# Patient Record
Sex: Female | Born: 1980 | Race: White | Hispanic: No | Marital: Married | Smoking: Never smoker
Health system: Southern US, Community
[De-identification: ages and names within clinical notes are randomized; demographics above are authoritative.]

## PROBLEM LIST (undated history)

## (undated) ENCOUNTER — Inpatient Hospital Stay (HOSPITAL_COMMUNITY): Payer: BC Managed Care – PPO

## (undated) DIAGNOSIS — D6851 Activated protein C resistance: Secondary | ICD-10-CM

## (undated) DIAGNOSIS — Z5189 Encounter for other specified aftercare: Secondary | ICD-10-CM

## (undated) DIAGNOSIS — D689 Coagulation defect, unspecified: Secondary | ICD-10-CM

## (undated) DIAGNOSIS — E010 Iodine-deficiency related diffuse (endemic) goiter: Secondary | ICD-10-CM

## (undated) DIAGNOSIS — IMO0001 Reserved for inherently not codable concepts without codable children: Secondary | ICD-10-CM

## (undated) DIAGNOSIS — O99119 Other diseases of the blood and blood-forming organs and certain disorders involving the immune mechanism complicating pregnancy, unspecified trimester: Secondary | ICD-10-CM

## (undated) HISTORY — DX: Iodine-deficiency related diffuse (endemic) goiter: E01.0

## (undated) HISTORY — DX: Coagulation defect, unspecified: D68.9

## (undated) HISTORY — DX: Other diseases of the blood and blood-forming organs and certain disorders involving the immune mechanism complicating pregnancy, unspecified trimester: O99.119

## (undated) HISTORY — DX: Other diseases of the blood and blood-forming organs and certain disorders involving the immune mechanism complicating pregnancy, unspecified trimester: D68.51

## (undated) HISTORY — DX: Reserved for inherently not codable concepts without codable children: IMO0001

## (undated) HISTORY — DX: Encounter for other specified aftercare: Z51.89

---

## 2005-10-05 ENCOUNTER — Other Ambulatory Visit: Admission: RE | Admit: 2005-10-05 | Discharge: 2005-10-05 | Payer: Self-pay | Admitting: Obstetrics and Gynecology

## 2005-10-13 ENCOUNTER — Ambulatory Visit: Payer: Self-pay | Admitting: Oncology

## 2005-12-29 ENCOUNTER — Ambulatory Visit: Payer: Self-pay | Admitting: Family Medicine

## 2006-12-13 HISTORY — PX: DILATION AND CURETTAGE OF UTERUS: SHX78

## 2007-01-11 ENCOUNTER — Ambulatory Visit: Payer: Self-pay | Admitting: Family Medicine

## 2007-01-11 ENCOUNTER — Other Ambulatory Visit: Admission: RE | Admit: 2007-01-11 | Discharge: 2007-01-11 | Payer: Self-pay | Admitting: Family Medicine

## 2007-01-11 ENCOUNTER — Encounter: Payer: Self-pay | Admitting: Family Medicine

## 2007-04-03 ENCOUNTER — Ambulatory Visit: Payer: Self-pay | Admitting: Family Medicine

## 2007-05-17 ENCOUNTER — Ambulatory Visit: Payer: Self-pay | Admitting: Oncology

## 2007-05-23 ENCOUNTER — Ambulatory Visit: Payer: Self-pay | Admitting: Cardiology

## 2007-07-06 ENCOUNTER — Other Ambulatory Visit: Payer: Self-pay | Admitting: Obstetrics & Gynecology

## 2007-07-06 ENCOUNTER — Ambulatory Visit (HOSPITAL_COMMUNITY): Admission: RE | Admit: 2007-07-06 | Discharge: 2007-07-06 | Payer: Self-pay | Admitting: Obstetrics & Gynecology

## 2007-07-06 ENCOUNTER — Encounter (INDEPENDENT_AMBULATORY_CARE_PROVIDER_SITE_OTHER): Payer: Self-pay | Admitting: Obstetrics & Gynecology

## 2008-09-28 ENCOUNTER — Inpatient Hospital Stay (HOSPITAL_COMMUNITY): Admission: AD | Admit: 2008-09-28 | Discharge: 2008-09-30 | Payer: Self-pay | Admitting: Obstetrics and Gynecology

## 2010-01-02 ENCOUNTER — Ambulatory Visit: Payer: Self-pay | Admitting: Family Medicine

## 2010-01-03 LAB — CONVERTED CEMR LAB
ALT: 21 units/L (ref 0–35)
Alkaline Phosphatase: 73 units/L (ref 39–117)
Basophils Absolute: 0 10*3/uL (ref 0.0–0.1)
Basophils Relative: 0.3 % (ref 0.0–3.0)
Bilirubin, Direct: 0 mg/dL (ref 0.0–0.3)
CO2: 25 meq/L (ref 19–32)
Chloride: 108 meq/L (ref 96–112)
Cholesterol: 215 mg/dL — ABNORMAL HIGH (ref 0–200)
Eosinophils Relative: 1.5 % (ref 0.0–5.0)
GFR calc non Af Amer: 124.73 mL/min (ref >60)
HDL: 116.4 mg/dL (ref >39.00)
Lymphocytes Relative: 41.7 % (ref 12.0–46.0)
MCHC: 32.8 g/dL (ref 30.0–36.0)
MCV: 99.8 fL (ref 78.0–100.0)
Monocytes Absolute: 0.4 10*3/uL (ref 0.1–1.0)
Monocytes Relative: 8.7 % (ref 3.0–12.0)
Neutro Abs: 2.3 10*3/uL (ref 1.4–7.7)
Neutrophils Relative %: 47.8 % (ref 43.0–77.0)
Platelets: 255 10*3/uL (ref 150.0–400.0)
Potassium: 4.1 meq/L (ref 3.5–5.1)
Sodium: 141 meq/L (ref 135–145)
TSH: 1.51 microintl units/mL (ref 0.35–5.50)
Total CHOL/HDL Ratio: 2
Total Protein: 8.4 g/dL — ABNORMAL HIGH (ref 6.0–8.3)
Triglycerides: 126 mg/dL (ref 0.0–149.0)
VLDL: 25.2 mg/dL (ref 0.0–40.0)

## 2010-01-07 ENCOUNTER — Ambulatory Visit: Payer: Self-pay | Admitting: Family Medicine

## 2010-01-07 DIAGNOSIS — G4700 Insomnia, unspecified: Secondary | ICD-10-CM

## 2010-01-07 DIAGNOSIS — D689 Coagulation defect, unspecified: Secondary | ICD-10-CM

## 2010-01-30 ENCOUNTER — Encounter: Payer: Self-pay | Admitting: Family Medicine

## 2010-02-02 ENCOUNTER — Telehealth: Payer: Self-pay | Admitting: Family Medicine

## 2010-03-30 ENCOUNTER — Ambulatory Visit: Payer: Self-pay | Admitting: Family Medicine

## 2010-03-30 DIAGNOSIS — J209 Acute bronchitis, unspecified: Secondary | ICD-10-CM

## 2010-12-13 NOTE — L&D Delivery Note (Signed)
Delivery note Pt began to feel pressure and on going to the restroom delivered a macerated fetus.  Cord clamped by RN and I arrived and examined pt. Placenta felt at os and pt attempted to push out remainder, but cord avulsed with placenta still in cervix.  Bleeding moderate with several clots passed.   D/w anesthesia and they felt epidural in room safest way to proceed with placenta delivery as pt has had clear liquids. Pt vital signs stable.  Will get epidural  and proceed with delivery of placenta as soon as pt comfortable.  Cursory examination of baby shows large abdominal wall defect with intestines and liver protruding, but fetus very macerated. At 11:20 PM a non-viable probable female was delivered via Vaginal, Spontaneous Delivery.  APGAR: 0, 0; weight 5 oz (142 g).      Anesthesia: None  Episiotomy: None Lacerations: None Est. Blood Loss (mL): 1000 cc  After epidural was found to be adequate, the patient was placed in stirrups and speculum placed in vagina.  Large amount of clot removed from vagina. Placenta seen  At os and several pieces teased out with ring forcep.  Remainder difficult to reach so additional cytotec placed while I attended another delivery.  Pt's bleeding minimal at this point.   After about 15  Minutes with the cytotec in place, pt reexamined and large piece of placenta removed intact.  The pt did have some brisker bleeding until this fragment was completely removed.   At this point US guidance was called and a gentle curettage with a Banjo curette done under direct visualization.  No other large fragments noted and stripe 1.2 cm at conclusion.  Uterus contracted down and pitocin and methergine given.  Pt had minimal bleeding at this point. Unasyn IV given for 2 doses given fundal exploration.  Will observe bleeding closely.  Oliver Pila 07/23/2011, 1:44 AM

## 2011-01-03 ENCOUNTER — Encounter: Payer: Self-pay | Admitting: Obstetrics and Gynecology

## 2011-01-13 NOTE — Progress Notes (Signed)
Summary: ? drug interaction  Phone Note From Pharmacy   Caller: [facility] Summary of Call: Form regarding possible drug interaction is on your shelf. Initial call taken by: Lowella Petties CMA,  February 02, 2010 12:42 PM  Follow-up for Phone Call        this is more or less a warning that silenor is not safe in pregnancy- and at any point if she could be pregnant - she stop it -- please make sure she is aware  I will sign the letter to scan also  Follow-up by: Judith Part MD,  February 02, 2010 1:33 PM  Additional Follow-up for Phone Call Additional follow up Details #1::        Left message for patient to call back. Lewanda Rife LPN  February 02, 2010 5:04 PM   Advised pt. Additional Follow-up by: Lowella Petties CMA,  February 03, 2010 9:38 AM

## 2011-01-13 NOTE — Assessment & Plan Note (Signed)
Summary: Complete Physical   Vital Signs:  Patient profile:   30 year old female Height:      68.5 inches Weight:      129 pounds BMI:     19.40 Temp:     97.4 degrees F oral Pulse rate:   72 / minute Pulse rhythm:   regular BP sitting:   116 / 84  (left arm) Cuff size:   regular  Vitals Entered By: Lowella Petties CMA (January 07, 2010 2:23 PM) CC: 30 minute check up   History of Present Illness: here for a health mt exam   is generally healthy and no new concerns  sees gyn for regular care -- Dr Senaida Ores / [place]  is thinking about getting pregnant again  is still on prenatal vitamins   wt is down about 5 lb -- from being busy -- and is a good eater   labs chol - trig 126, and HDL 116.4 , and LDL 88  excellent overall  does eat well - good cholesterol   tsh nl  sugar 107 --  other labs ok   is having trouble staying asleep -- and that is catching up with her  wakes up at 2-4 am  is not breastfeeding  pm products - make her groggy  no caffiene  has never tried volarian or melatonin   not feeling depressed / but does feel a little cooked up this time of year  Dr Lloyd Huger px that for her -- but she did not try it    Allergies: 1)  ! Sulfa 2)  ! * Some Vitamins  Past History:  Family History: Last updated: 01/07/2010 sister with breast ca sister with lung ca  Past Medical History: clotting disorder   Family History: sister with breast ca sister with lung ca  Social History: non smoker  no alcohol married 1 child  Review of Systems General:  Complains of fatigue; denies fever, loss of appetite, and malaise. Eyes:  Denies blurring and eye pain. CV:  Denies chest pain or discomfort, lightheadness, palpitations, and shortness of breath with exertion. Resp:  Denies cough and wheezing. GI:  Denies abdominal pain, bloody stools, change in bowel habits, and indigestion. GU:  Denies discharge, dysuria, and urinary frequency. MS:  Denies joint  pain, cramps, and muscle weakness. Derm:  Denies itching, lesion(s), poor wound healing, and rash. Neuro:  Denies numbness and tingling. Psych:  Denies anxiety, depression, panic attacks, and sense of great danger. Endo:  Denies cold intolerance, excessive thirst, excessive urination, and heat intolerance. Heme:  Denies abnormal bruising and bleeding.  Physical Exam  General:  slim and well appearing  Head:  normocephalic, atraumatic, and no abnormalities observed.   Eyes:  vision grossly intact, pupils equal, pupils round, pupils reactive to light, and no injection.   Ears:  R ear normal and L ear normal.   Nose:  no nasal discharge.   Mouth:  pharynx pink and moist.   Neck:  supple with full rom and no masses or thyromegally, no JVD or carotid bruit  Chest Wall:  No deformities, masses, or tenderness noted. Lungs:  Normal respiratory effort, chest expands symmetrically. Lungs are clear to auscultation, no crackles or wheezes. Heart:  Normal rate and regular rhythm. S1 and S2 normal without gallop, murmur, click, rub or other extra sounds. Abdomen:  Bowel sounds positive,abdomen soft and non-tender without masses, organomegaly or hernias noted. Msk:  No deformity or scoliosis noted of thoracic or lumbar spine.  no acute joint changes  Pulses:  R and L carotid,radial,femoral,dorsalis pedis and posterior tibial pulses are full and equal bilaterally Extremities:  No clubbing, cyanosis, edema, or deformity noted with normal full range of motion of all joints.   Neurologic:  sensation intact to light touch, gait normal, and DTRs symmetrical and normal.   Skin:  Intact without suspicious lesions or rashes some lentigos on face and back  1 mm lesion on r inner eyelid- consistent with milia/ comedone Cervical Nodes:  No lymphadenopathy noted Inguinal Nodes:  No significant adenopathy Psych:  normal affect, talkative and pleasant    Impression & Recommendations:  Problem # 1:  HEALTH  MAINTENANCE EXAM (ICD-V70.0) Assessment Comment Only reviewed health habits including diet, exercise and skin cancer prevention reviewed health maintenance list and family history overall healthy with good habits  plan on getting back to exercise when able and continue pnvs  labs reviewed today including good cholesterol profile   Problem # 2:  INSOMNIA (ICD-780.52) Assessment: New with problems staying asleep-multifactorial  failed antihistamines and otc meds will try silenor 6 mg and update  if not imp- may consider trial of ambien Her updated medication list for this problem includes:    Silenor 6 Mg Tabs (Doxepin hcl) .Marland Kitchen... 1/2 to 1 tab by mouth at bedtime as needed insomnia  Complete Medication List: 1)  Prenatal Vitamins  2)  Silenor 6 Mg Tabs (Doxepin hcl) .... 1/2 to 1 tab by mouth at bedtime as needed insomnia  Other Orders: Tdap => 21yrs IM ([address]) Admin 1st Vaccine ([address]) Admin 1st Vaccine [facility]) [phone])  Patient Instructions: 1)  avoid caffiene as much as you can  2)  when able -- get back to exercise  3)  continue prenatal vitamins  4)  try the silanor for sleep  5)  if you want dermatology referral in future- call and let me know  Prescriptions: SILENOR 6 MG TABS (DOXEPIN HCL) 1/2 to 1 tab by mouth at bedtime as needed insomnia  #30 x 3   Entered and Authorized by:   Judith Part MD   Signed by:   Judith Part MD on 01/07/2010   Method used:   [facility]        [facility] [facility]retail)       [address]       [place], [place]  [address]       Ph: [phone] or [phone]       Fax: [phone]   RxID:   [id number]   Prior Medications: Current Allergies: ! SULFA ! * SOME VITAMINS   Preventive Care Screening  Pap Smear:    Date:  11/06/2008    Results:  normal     Tetanus/Td Vaccine    Vaccine Type: Tdap    Site: right deltoid    Mfr: GlaxoSmithKline    Dose: 0.5 ml    Route: IM    Given by: Lowella Petties CMA    Exp. Date: 02/07/2012    Lot #: [id number]    VIS given: 10/31/07 version given January 07, 2010.

## 2011-01-13 NOTE — Assessment & Plan Note (Signed)
Summary: sinus infection/alc 2:15   Vital Signs:  Patient profile:   30 year old female Height:      68.5 inches Weight:      126.75 pounds BMI:     19.06 O2 Sat:      98 % on Room air Temp:     98.4 degrees F oral Pulse rate:   76 / minute Pulse rhythm:   regular Resp:     16 per minute BP sitting:   116 / 80  (left arm) Cuff size:   regular  Vitals Entered By: Lewanda Rife LPN (March 30, 2010 2:11 PM)  O2 Flow:  Room air CC: sinus infections and chest congestion and dry cough   History of Present Illness: 30 yo female here for URI symptoms.  Started with runny nose, dry cough 1 month ago.  At that time had fevers and chills, that has resolved. Still has some sinus congestion and frontal head pressure. Also feels a little winded at time over the past month, not getting acutely worse.  No wheezing. No chest pain.  Does have seasonal allergies, not really taking anything for it. No h/o asthma.  Current Medications (verified): 1)  Silenor 6 Mg Tabs (Doxepin Hcl) .... 1/2 To 1 Tab By Mouth At Bedtime As Needed Insomnia 2)  Camila 0.35 Mg Tabs (Norethindrone) .... Take 1 Tablet By Mouth Once A Day 3)  Multivitamins   Tabs (Multiple Vitamin) .... Take 1 Tablet By Mouth Once A Day 4)  Azithromycin 250 Mg  Tabs (Azithromycin) .... 2 By  Mouth Today and Then 1 Daily For 4 Days 5)  Proair Hfa 108 (90 Base) Mcg/act  Aers (Albuterol Sulfate) .... 2 Inh Q4h As Needed Shortness of Breath  Allergies: 1)  ! Sulfa 2)  ! * Some Vitamins  Review of Systems      See HPI General:  Complains of chills and fever. ENT:  Complains of nasal congestion, sinus pressure, and sore throat; denies difficulty swallowing. CV:  Denies chest pain or discomfort. Resp:  Complains of cough and shortness of breath; denies chest pain with inspiration, sputum productive, and wheezing.  Physical Exam  General:  slim and well appearing no increased WOB VSS- 02 sat 98% RA Ears:  R ear normal and L ear  normal.   Nose:  boggy turbinates, sinuses neg Mouth:  pharynx pink and moist.   Lungs:  Normal respiratory effort, chest expands symmetrically.  Scattered exp wheezes, no crackles Heart:  Normal rate and regular rhythm. S1 and S2 normal without gallop, murmur, click, rub or other extra sounds. Abdomen:  Bowel sounds positive,abdomen soft and non-tender without masses, organomegaly or hernias noted. Extremities:  No clubbing, cyanosis, edema, or deformity noted with normal full range of motion of all joints.   Psych:  normal affect, talkative and pleasant    Impression & Recommendations:  Problem # 1:  ACUTE BRONCHITIS (ICD-466.0) Assessment New Given duration of symptoms, will treat with Zpack. Proair as needed wheezing. Follow up if no improvement in 5 days. Her updated medication list for this problem includes:    Azithromycin 250 Mg Tabs (Azithromycin) .Marland Kitchen... 2 by  mouth today and then 1 daily for 4 days    Proair Hfa 108 (90 Base) Mcg/act Aers (Albuterol sulfate) .Marland Kitchen... 2 inh q4h as needed shortness of breath  Complete Medication List: 1)  Silenor 6 Mg Tabs (Doxepin hcl) .... 1/2 to 1 tab by mouth at bedtime as needed insomnia 2)  Camila  0.35 Mg Tabs (Norethindrone) .... Take 1 tablet by mouth once a day 3)  Multivitamins Tabs (Multiple vitamin) .... Take 1 tablet by mouth once a day 4)  Azithromycin 250 Mg Tabs (Azithromycin) .... 2 by  mouth today and then 1 daily for 4 days 5)  Proair Hfa 108 (90 Base) Mcg/act Aers (Albuterol sulfate) .... 2 inh q4h as needed shortness of breath Prescriptions: PROAIR HFA 108 (90 BASE) MCG/ACT  AERS (ALBUTEROL SULFATE) 2 inh q4h as needed shortness of breath  #1 x 0   Entered and Authorized by:   Ruthe Mannan MD   Signed by:   Ruthe Mannan MD on 03/30/2010   Method used:   [facility]        [facility] [facility]       [address]       [place], [place]  [address]       Ph: [phone] or [phone]       Fax:  [phone]   RxID:   [id number] AZITHROMYCIN 250 MG  TABS (AZITHROMYCIN) 2 by  mouth today and then 1 daily for 4 days  #6 x 0   Entered and Authorized by:   Ruthe Mannan MD   Signed by:   Ruthe Mannan MD on 03/30/2010   Method used:   [facility]        [facility] [facility]retail[facility]       [address]       [place], [place]  [address]       Ph: [phone] or [phone]       Fax: [phone]   RxID:   [id number]   Current Allergies (reviewed today): ! SULFA ! * SOME VITAMINS

## 2011-01-13 NOTE — Medication Information (Signed)
Summary: Letter Regarding Silenor & Pregnancy/[facility]  Letter Regarding Silenor & Pregnancy/[facility]   Imported By: Lanelle Bal 02/09/2010 11:30:26  _____________________________________________________________________  External Attachment:    Type:   Image     Comment:   External Document

## 2011-03-14 LAB — HM PAP SMEAR

## 2011-04-27 NOTE — Assessment & Plan Note (Signed)
[facility] OFFICE NOTE   THEONE, BOWELL                    MRN:          [id number]  DATE:05/23/2007                            DOB:          1981/07/31    I was asked by Dr. Konrad Dolores to consult on [name], a delightful,  30 year old married white female who has a murmur.   HISTORY OF PRESENT ILLNESS:  She has no previous cardiac history.  She  is extremely healthy and health conscious.  The murmur was noted on exam  with Dr. Jennette Kettle.   She is [redacted] weeks pregnant.   She states that she has no chest discomfort, shortness of breath,  orthopnea, PND, tachy palpitations, presyncope, syncope.  She has never  been told that she had a murmur before.   Recent blood work demonstrated a normal thyroid panel as well as a  unremarkable CBC.  I do not have those results, but the patient states  that this has been looked at.  She, apparently, has a factor V disorder,  and has seen a hematologist.   PAST MEDICAL HISTORY:  She is on prenatal vitamins and an aspirin 81 mg  a day.   She is intolerant to SULFA DRUGS.   She does not smoke, but did, but quit in 2001.   She does not do any recreational drugs, she does not drink any alcohol,  she does not drink caffeine.  She denied exercising currently.   She has had surgery.  She has had wisdom tooth extraction in 2006.   FAMILY HISTORY:  Her mother and father have high blood pressure, but no  premature history of coronary disease.  There is no history of sudden  cardiac death or heart murmurs.   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  She is  married and has no children.  She is pregnant as mentioned above.  [place]   REVIEW OF SYSTEMS:  Totally negative other than HPI.   PHYSICAL EXAMINATION:  Her blood pressure was 104/68, her pulse is 64  and regular.  She is 5 feet 11 inches, weighs 135 pounds.  She is in no acute distress.  HEENT:  Unremarkable.  Carotid upstrokes are equal bilaterally with a soft systolic sound in  the right base.  Her thyroid is palpable and is symmetrical.  There is  no bruit.  There is no tenderness.  There is no JVD.  LUNGS:  Clear to auscultation.  HEART:  Reveals a nondisplaced PMI.  She has a normal S1, but her S2 is  split and splits wider with inspiration (this is due to her incomplete  right bundle branch block on her EKG).  There is no gallop or rub.  She  has a 2/6 systolic murmur at the apex.  There was no click.  There is no  diastolic murmur.  ABDOMINAL EXAM:  Soft with good bowel sounds, no midline bruit.  EXTREMITIES:  Reveal no cyanosis, clubbing, or edema.  Pulses are  intact.  NEURO EXAM:  Grossly  intact.   EKG shows sinus rhythm with an incomplete right bundle branch block.  Her PR, QRS, and QTC intervals are normal.   ASSESSMENT:  1. Systolic flow murmur, secondary to pregnancy as well as a      relatively thin body habitus.  2. Incomplete right bundle, asymptomatic.  3. History of hematological factor V disorder.  Details unknown.   RECOMMENDATIONS:  1. Reassurance.  2. I made her aware that she has an incomplete right bundle so that      she could tell physician encounters in the future.  3. See [place] back p.r.n.     Thomas C. Daleen Squibb, MD, [facility]  Electronically Signed    TCW/MedQ  DD: 05/23/2007  DT: 05/23/2007  Job #: [id number]   cc:   Freddy Finner, M.D.

## 2011-04-27 NOTE — Op Note (Signed)
[name], [name]             ACCOUNT NO.:  [id number]   MEDICAL RECORD NO.:  [id number]          PATIENT TYPE:  AMB   LOCATION:  SDC                           [facility]ILITY:  [facility]   PHYSICIAN:  Freddy Finner, M.D.   DATE OF BIRTH:  04-01-1981   DATE OF PROCEDURE:  07/06/2007  DATE OF DISCHARGE:                               OPERATIVE REPORT   PREOPERATIVE DIAGNOSIS:  Intrauterine fetal demise, estimated  gestational age at demise was approximately 13 weeks.   POSTOPERATIVE DIAGNOSIS:  Intrauterine fetal demise, estimated  gestational age at demise was approximately 13 weeks.   OPERATION:  Dilatation and evacuation.   ANESTHESIA:  General.   ESTIMATED INTRAOPERATIVE BLOOD LOSS:  50 mL.   INTERRUPTED COMPLICATIONS:  None.   INDICATIONS:  The patient is a 30 year old white married female who had  an uneventful prenatal course until approximately 5 days prior to her  admission today.  She was seen for routine obstetrical examination and  was found to have an intrauterine fetal demise.  She was admitted today  for D&E.  She was admitted on the morning of surgery.   DESCRIPTION OF PROCEDURE:  She was brought to the operating room.  She  received a bolus of Ancef IV.  She was placed under general anesthesia,  placed in the dorsal lithotomy position using the Allen-stirrup system.  Betadine prep of mons, perineum, and vagina was carried out in the usual  fashion; and sterile drapes were applied.  Bivalve speculum was  introduced.  The cervix was visualized and grasped on the anterior lip  with a single-tooth tenaculum.  A paracervical block was placed using a  total of 10 mL of 1% plain Xylocaine.  Injections were made at 4 and 8  o'clock in the vaginal fornices.   Cervix was then progressively dilated with Pratt's to approximately 33.  A 12-mm, curved, suction cannula was introduced and aspiration produced  obvious products of conception.  The uterus was further explored with  polyp forceps; and this was followed by gentle through endometrial  curettage.  Repeat vacuum aspiration was carried out.  It was felt that  the cavity was completely evacuated.  Products of conception were  examined and fetal parts were accounted for.   The procedure, at this point, was terminated.  The instruments were  removed. The patient was awakened and taken to the recovery room in good  condition.  She is known to be Rh positive.  She is to return to the  office in two weeks for follow up.  She is given routine postoperative  instructions including to call for fever or for heavy vaginal bleeding.  She was experiencing a little heavier than normal flow in the recovery  room; and verbal order was given for 0.2% of Methergine IM.   The patient has Vicodin to be taken as needed for postoperative pain.  She has Xanax 0.5 to be taken t.i.d. as needed for anxiety, and she has  Ambien 10 mg to be taken at bedtime as needed for sleep.      Freddy Finner, M.D.  Electronically Signed     WRN/MEDQ  D:  07/06/2007  T:  07/06/2007  Job:  [id number]

## 2011-04-27 NOTE — Discharge Summary (Signed)
[name], [name]             ACCOUNT NO.:  [id number]   MEDICAL RECORD NO.:  [id number]          PATIENT TYPE:  INP   LOCATION:  9125                          FACILITY:  [facility]   PHYSICIAN:  Zenaida Niece, M.D.DATE OF BIRTH:  12-13-1981   DATE OF ADMISSION:  09/28/2008  DATE OF DISCHARGE:  09/30/2008                               DISCHARGE SUMMARY   ADMISSION DIAGNOSES:  1. Intrauterine pregnancy at 38 weeks.  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   DISCHARGE DIAGNOSES:  1. Intrauterine pregnancy at 38 weeks.  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   PROCEDURES:  On September 28, 2008, she had a vacuum-assisted vaginal  delivery and a repair of a third-degree extension.   HISTORY AND PHYSICAL:  This is a 30 year old gravida 3, para 0-0-2-0  with an EGA of 38 plus weeks who presented with a complaint of regular  contractions.  Evaluation in triage revealed regular contractions.  Cervix was 2, complete and 0.  Prenatal care complicated by the fact  that the patient is a carrier for factor V Leiden and prothrombin 2 and  has been on prophylactic Lovenox.  Her last dose was 36 hours prior to  admission.   PRENATAL LABORATORY DATA:  Blood type is O positive with negative  antibody screen, RPR nonreactive, hepatitis B surface antigen negative,  rubella immune, HIV negative, gonorrhea and chlamydia negative, first  trimester screen is normal, MSAFP is normal, 1-hour glucola 96, group B  strep is negative.   PAST OBSTETRICAL HISTORY:  Spontaneous abortion x2.   PAST MEDICAL HISTORY:  1. 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  2. 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   PAST SURGICAL HISTORY:  D&C, wisdom tooth removal.   ALLERGIES:  SULFA.   MEDICATIONS:  Lovenox 40 mg b.i.d.   PHYSICAL EXAMINATION:  GENERAL:  She is afebrile with stable vital  signs.  Fetal heart tracing reactive.  ABDOMEN:  Gravid, nontender with an estimated fetal weight of 7 pounds.  EXTERNAL  GENITALIA:  Cervix on my first exam she is 4 complete and 0  with a vertex presentation, adequate pelvis and membranes were ruptured  revealing clear fluid.   HOSPITAL COURSE:  The patient was admitted in early labor.  She  continued to contract on her own and progressed to 4 cm.  Membranes were  ruptured revealing clear fluid.  She was also put on Pitocin for  augmentation.  She received an epidural, progressed to complete and  pushed for an hour, became exhausted and lightheaded.  She had a vacuum-  assisted vaginal delivery of a viable female with Apgars of 9 and 10 and  weighed 7 pounds 6 ounces.  Placenta delivered spontaneously and it was  intact.  She had a second-degree episiotomy with a third-degree  extension repaired with 2-0 and 3-0 Vicryl and rectum was intact.  Estimated blood loss was 500 mL.  She was started back on her Lovenox  approximately 10 hours after delivery.  Pre-delivery hemoglobin 11.7,  post-delivery 10.6.  She had no significant complications, and on  postpartum #2, she  was felt to be stable enough for discharge home.   DISCHARGE INSTRUCTIONS:  Regular diet.  Pelvic rest.  Follow-up in 6  weeks.   Medications area over-the-counter ibuprofen as needed, Percocet #20 one  to two p.o. q. 4-6 h. p.r.n. pain, and over-the-counter stool softeners  due to the third-degree laceration.  She is given our discharge  pamphlet.      Zenaida Niece, M.D.  Electronically Signed     TDM/MEDQ  D:  09/30/2008  T:  09/30/2008  Job:  [id number]

## 2011-05-03 ENCOUNTER — Ambulatory Visit (HOSPITAL_COMMUNITY)
Admission: RE | Admit: 2011-05-03 | Discharge: 2011-05-03 | Disposition: A | Discharge status: Home / Self Care | Payer: BC Managed Care – PPO | Source: Ambulatory Visit | Attending: Obstetrics and Gynecology | Admitting: Obstetrics and Gynecology

## 2011-05-03 ENCOUNTER — Other Ambulatory Visit (HOSPITAL_COMMUNITY): Payer: Self-pay | Admitting: Obstetrics and Gynecology

## 2011-05-03 DIAGNOSIS — O209 Hemorrhage in early pregnancy, unspecified: Secondary | ICD-10-CM

## 2011-05-03 DIAGNOSIS — Z3689 Encounter for other specified antenatal screening: Secondary | ICD-10-CM | POA: Insufficient documentation

## 2011-05-03 DIAGNOSIS — O3680X0 Pregnancy with inconclusive fetal viability, not applicable or unspecified: Secondary | ICD-10-CM

## 2011-05-28 ENCOUNTER — Encounter: Payer: Self-pay | Admitting: Cardiovascular Disease

## 2011-06-22 LAB — ABO/RH: RH Type: POSITIVE

## 2011-06-22 LAB — HEPATITIS B SURFACE ANTIGEN: Hepatitis B Surface Ag: NEGATIVE

## 2011-06-22 LAB — RUBELLA ANTIBODY, IGM: Rubella: IMMUNE

## 2011-06-22 LAB — RPR: RPR: NONREACTIVE

## 2011-07-22 ENCOUNTER — Inpatient Hospital Stay (HOSPITAL_COMMUNITY): Payer: BC Managed Care – PPO | Admitting: Anesthesiology

## 2011-07-22 ENCOUNTER — Encounter (HOSPITAL_COMMUNITY): Payer: Self-pay | Admitting: *Deleted

## 2011-07-22 ENCOUNTER — Inpatient Hospital Stay (HOSPITAL_COMMUNITY)
Admission: AD | Admit: 2011-07-22 | Discharge: 2011-07-23 | DRG: 380 | Disposition: A | Discharge status: Home / Self Care | Payer: BC Managed Care – PPO | Source: Ambulatory Visit | Attending: Obstetrics and Gynecology | Admitting: Obstetrics and Gynecology

## 2011-07-22 ENCOUNTER — Encounter (HOSPITAL_COMMUNITY): Payer: Self-pay | Admitting: Anesthesiology

## 2011-07-22 DIAGNOSIS — G4700 Insomnia, unspecified: Secondary | ICD-10-CM

## 2011-07-22 DIAGNOSIS — J209 Acute bronchitis, unspecified: Secondary | ICD-10-CM

## 2011-07-22 DIAGNOSIS — O021 Missed abortion: Principal | ICD-10-CM | POA: Diagnosis present

## 2011-07-22 DIAGNOSIS — D689 Coagulation defect, unspecified: Secondary | ICD-10-CM

## 2011-07-22 DIAGNOSIS — IMO0002 Reserved for concepts with insufficient information to code with codable children: Secondary | ICD-10-CM

## 2011-07-22 LAB — CBC
HCT: 36.5 % (ref 36.0–46.0)
Hemoglobin: 12.9 g/dL (ref 12.0–15.0)
MCH: 32.3 pg (ref 26.0–34.0)
MCV: 91.3 fL (ref 78.0–100.0)
Platelets: 259 10*3/uL (ref 150–400)
RBC: 4 MIL/uL (ref 3.87–5.11)
WBC: 11.8 10*3/uL — ABNORMAL HIGH (ref 4.0–10.5)

## 2011-07-22 MED ORDER — EPHEDRINE 5 MG/ML INJ
10.0000 mg | INTRAVENOUS | Status: DC | PRN
  Filled 2011-07-22: qty 4

## 2011-07-22 MED ORDER — LACTATED RINGERS IV SOLN
500.0000 mL | Freq: Once | INTRAVENOUS | Status: DC
  Administered 2011-07-23: 04:00:00 via INTRAVENOUS

## 2011-07-22 MED ORDER — BUTORPHANOL TARTRATE 2 MG/ML IJ SOLN
1.0000 mg | INTRAMUSCULAR | Status: DC | PRN
  Administered 2011-07-22: 1 mg via INTRAVENOUS
  Filled 2011-07-22: qty 1

## 2011-07-22 MED ORDER — OXYCODONE-ACETAMINOPHEN 5-325 MG PO TABS: 2.0000 | ORAL_TABLET | ORAL | Status: DC | PRN

## 2011-07-22 MED ORDER — SODIUM CHLORIDE 0.9 % IV SOLN: 250.0000 mL | INTRAVENOUS | Status: DC

## 2011-07-22 MED ORDER — LIDOCAINE HCL (PF) 1 % IJ SOLN
30.0000 mL | INTRAMUSCULAR | Status: DC | PRN
  Filled 2011-07-22: qty 30

## 2011-07-22 MED ORDER — ONDANSETRON HCL 4 MG/2ML IJ SOLN
4.0000 mg | Freq: Four times a day (QID) | INTRAMUSCULAR | Status: DC | PRN
  Administered 2011-07-23: 4 mg via INTRAVENOUS
  Filled 2011-07-22: qty 2

## 2011-07-22 MED ORDER — IBUPROFEN 600 MG PO TABS: 600.0000 mg | ORAL_TABLET | Freq: Four times a day (QID) | ORAL | Status: DC | PRN

## 2011-07-22 MED ORDER — ACETAMINOPHEN 325 MG PO TABS: 650.0000 mg | ORAL_TABLET | ORAL | Status: DC | PRN

## 2011-07-22 MED ORDER — PHENYLEPHRINE 40 MCG/ML (10ML) SYRINGE FOR IV PUSH (FOR BLOOD PRESSURE SUPPORT)
80.0000 ug | PREFILLED_SYRINGE | INTRAVENOUS | Status: DC | PRN
  Filled 2011-07-22: qty 5

## 2011-07-22 MED ORDER — MISOPROSTOL 200 MCG PO TABS
600.0000 ug | ORAL_TABLET | Freq: Four times a day (QID) | ORAL | Status: DC
  Administered 2011-07-22 – 2011-07-23 (×2): 600 ug via VAGINAL
  Filled 2011-07-22 (×3): qty 3

## 2011-07-22 MED ORDER — LACTATED RINGERS IV SOLN
INTRAVENOUS | Status: DC
  Administered 2011-07-22 (×2): via INTRAVENOUS

## 2011-07-22 MED ORDER — EPHEDRINE 5 MG/ML INJ
10.0000 mg | INTRAVENOUS | Status: DC | PRN
  Filled 2011-07-22 (×2): qty 4

## 2011-07-22 MED ORDER — LACTATED RINGERS IV SOLN: 500.0000 mL | INTRAVENOUS | Status: DC | PRN

## 2011-07-22 MED ORDER — OXYTOCIN 20 UNITS IN LACTATED RINGERS INFUSION - SIMPLE
INTRAVENOUS | Status: AC
  Administered 2011-07-23: 20000 m[IU] via INTRAVENOUS
  Filled 2011-07-22: qty 1000

## 2011-07-22 MED ORDER — SODIUM CHLORIDE 0.9 % IJ SOLN: 3.0000 mL | INTRAMUSCULAR | Status: DC | PRN

## 2011-07-22 MED ORDER — ALBUTEROL SULFATE HFA 108 (90 BASE) MCG/ACT IN AERS
2.0000 | INHALATION_SPRAY | RESPIRATORY_TRACT | Status: DC | PRN
  Filled 2011-07-22: qty 6.7

## 2011-07-22 MED ORDER — DIPHENHYDRAMINE HCL 50 MG/ML IJ SOLN: 12.5000 mg | INTRAMUSCULAR | Status: DC | PRN

## 2011-07-22 MED ORDER — SODIUM CHLORIDE 0.9 % IJ SOLN: 3.0000 mL | Freq: Two times a day (BID) | INTRAMUSCULAR | Status: DC

## 2011-07-22 MED ORDER — PHENYLEPHRINE 40 MCG/ML (10ML) SYRINGE FOR IV PUSH (FOR BLOOD PRESSURE SUPPORT)
80.0000 ug | PREFILLED_SYRINGE | INTRAVENOUS | Status: DC | PRN
  Filled 2011-07-22 (×2): qty 5

## 2011-07-22 MED ORDER — CITRIC ACID-SODIUM CITRATE 334-500 MG/5ML PO SOLN: 30.0000 mL | ORAL | Status: DC | PRN

## 2011-07-22 NOTE — H&P (Signed)
[name] is a 30 y.o. female [phone] presenting with unfortunate finding of fetal demise at 18+ weeks gestation. (EDD 12/22/11 by LMP c/w 6 week [place]) Pt had abnormal AFP of 1:33 and came in for [place] with demise noted and no fluid seen.  Anatomy hard to visualize due to low fluid.  Pt denies any problems, LOF or pain. 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  No other known issues besides the abnormal AFP. History OB History    Grav Para Term Preterm Abortions TAB SAB Ect Mult Living   4 1 1  0 2 0 2 0 0 1    2009 NSVD 7#6oz 2008 SAB  D&E 2008 SAB  Past Med HX Prothrombin gene mutation  Past Surgical History  Procedure Date  . Dilation and curettage of uterus 2008   Family History: family history includes Lung cancer in her sisters. Social History:  reports that she has never smoked. She does not have any smokeless tobacco history on file. She reports that she does not drink alcohol or use illicit drugs.  Review of Systems  Gastrointestinal: Negative for abdominal pain.    Exam by:: Dr. Senaida Ores (Cytotec per vagina at this time.) Blood pressure 106/65, pulse 74, temperature 98 F (36.7 C), temperature source Oral, resp. rate 18, height 5\' 11"  (1.803 m), weight 56.246 kg (124 lb), last menstrual period 03/17/2011. Maternal Exam:  Introitus: Normal vulva. Normal vagina.  Cervix: Cervix evaluated by digital exam.    Cervix long, closed and high Physical Exam  Constitutional: She appears well-developed.  Cardiovascular: Normal rate and regular rhythm.   Respiratory: Effort normal and breath sounds normal.  GI: Soft.  Genitourinary: Vagina normal.  Skin: Skin is warm.  Psychiatric: She has a normal mood and affect.    Prenatal labs: ABO, Rh:  O positive Antibody: Negative (07/10 0000) Rubella:  Immune RPR: Nonreactive (07/10 0000)  HBsAg: Negative (07/10 0000)  HIV: Non-reactive (07/10 0000)  GBS:   unknown AFP  1:33 First trimester screen WNL  Assessment/Plan: Pt admitted for cytotec induction of labor with 18 week fetal demise.  Has been off Lovenox for 24 hours, will restart after delivery.  D/w pt possible retained  Placenta and need for D&C.  First dose of cytotec placed.  d/w pt pain options including epidural and IV meds.  Emotional support given.  Pt has had extensive  lab work in past for missed ab's which resulted in discovery of prothrombin gene mutation.  WIll assess fetus for anatomical defects and if negative, consider TORCH  titres.  Oliver Pila 07/22/2011, 8:21 PM

## 2011-07-22 NOTE — Progress Notes (Signed)
Cord prolapsed through cervix onto perineum.  VE with no fetal parts in vagina yet.  Dr Senaida Ores updated and in route to assess pt.

## 2011-07-22 NOTE — Anesthesia Preprocedure Evaluation (Signed)
Anesthesia Evaluation  Name, MR# and DOB Patient awake  General Assessment Comment  Reviewed: Allergy & Precautions, H&P , Patient's Chart, lab work & pertinent test results  Airway Mallampati: II TM Distance: >3 FB Neck ROM: full    Dental No notable dental hx.    Pulmonary  clear to auscultation  pulmonary exam normalPulmonary Exam Normal breath sounds clear to auscultation none    Cardiovascular regular Normal    Neuro/Psych Negative Neurological ROS  Negative Psych ROS  GI/Hepatic/Renal negative GI ROS, negative Liver ROS, and negative Renal ROS (+)       Endo/Other  Negative Endocrine ROS (+)      Abdominal   Musculoskeletal   Hematology negative hematology ROS (+)   Peds  Reproductive/Obstetrics (+) Pregnancy    Anesthesia Other Findings Prothrombin gene mutation on lovenox last dose >24 hours ago with retained products and need for anesthesia for removal of products            Anesthesia Physical Anesthesia Plan  ASA: III and Emergent  Anesthesia Plan: Epidural   Post-op Pain Management:    Induction:   Airway Management Planned:   Additional Equipment:   Intra-op Plan:   Post-operative Plan:   Informed Consent: I have reviewed the patients History and Physical, chart, labs and discussed the procedure including the risks, benefits and alternatives for the proposed anesthesia with the patient or authorized representative who has indicated his/her understanding and acceptance.     Plan Discussed with:   Anesthesia Plan Comments:         Anesthesia Quick Evaluation

## 2011-07-23 ENCOUNTER — Encounter (HOSPITAL_COMMUNITY): Payer: Self-pay | Admitting: *Deleted

## 2011-07-23 ENCOUNTER — Other Ambulatory Visit: Payer: Self-pay | Admitting: Obstetrics and Gynecology

## 2011-07-23 LAB — CBC
HCT: 28.9 % — ABNORMAL LOW (ref 36.0–46.0)
Hemoglobin: 10.2 g/dL — ABNORMAL LOW (ref 12.0–15.0)
MCV: 90.6 fL (ref 78.0–100.0)
RBC: 3.19 MIL/uL — ABNORMAL LOW (ref 3.87–5.11)
WBC: 10.5 10*3/uL (ref 4.0–10.5)

## 2011-07-23 MED ORDER — METHYLERGONOVINE MALEATE 0.2 MG/ML IJ SOLN
INTRAMUSCULAR | Status: AC
  Administered 2011-07-23: 0.2 mg via INTRAMUSCULAR
  Filled 2011-07-23: qty 1

## 2011-07-23 MED ORDER — PRENATAL PLUS 27-1 MG PO TABS
1.0000 | ORAL_TABLET | Freq: Every day | ORAL | Status: DC
  Administered 2011-07-23: 1 via ORAL
  Filled 2011-07-23: qty 1

## 2011-07-23 MED ORDER — WITCH HAZEL-GLYCERIN EX PADS: 1.0000 "application " | MEDICATED_PAD | CUTANEOUS | Status: DC | PRN

## 2011-07-23 MED ORDER — TETANUS-DIPHTH-ACELL PERTUSSIS 5-2.5-18.5 LF-MCG/0.5 IM SUSP
0.5000 mL | Freq: Once | INTRAMUSCULAR | Status: DC
  Filled 2011-07-23: qty 0.5

## 2011-07-23 MED ORDER — SIMETHICONE 80 MG PO CHEW: 80.0000 mg | CHEWABLE_TABLET | ORAL | Status: DC | PRN

## 2011-07-23 MED ORDER — LIDOCAINE HCL 1.5 % IJ SOLN
INTRAMUSCULAR | Status: DC | PRN
  Administered 2011-07-23 (×2): 5 mL via EPIDURAL

## 2011-07-23 MED ORDER — DIPHENHYDRAMINE HCL 25 MG PO CAPS: 25.0000 mg | ORAL_CAPSULE | Freq: Four times a day (QID) | ORAL | Status: DC | PRN

## 2011-07-23 MED ORDER — SODIUM CHLORIDE 0.9 % IV SOLN
3.0000 g | Freq: Four times a day (QID) | INTRAVENOUS | Status: AC
  Administered 2011-07-23 (×2): 3 g via INTRAVENOUS
  Filled 2011-07-23 (×2): qty 3

## 2011-07-23 MED ORDER — IBUPROFEN 600 MG PO TABS
600.0000 mg | ORAL_TABLET | Freq: Four times a day (QID) | ORAL | Status: DC
  Administered 2011-07-23 (×2): 600 mg via ORAL
  Filled 2011-07-23 (×2): qty 1

## 2011-07-23 MED ORDER — BENZOCAINE-MENTHOL 20-0.5 % EX AERO: 1.0000 "application " | INHALATION_SPRAY | CUTANEOUS | Status: DC | PRN

## 2011-07-23 MED ORDER — SODIUM BICARBONATE 8.4 % IV SOLN
INTRAVENOUS | Status: DC | PRN
  Administered 2011-07-23: 5 mL via EPIDURAL

## 2011-07-23 MED ORDER — DIBUCAINE 1 % RE OINT: 1.0000 "application " | TOPICAL_OINTMENT | RECTAL | Status: DC | PRN

## 2011-07-23 MED ORDER — MISOPROSTOL 200 MCG PO TABS
ORAL_TABLET | ORAL | Status: AC
  Administered 2011-07-23: 600 ug via VAGINAL
  Filled 2011-07-23: qty 3

## 2011-07-23 MED ORDER — IBUPROFEN 600 MG PO TABS: 600.0000 mg | ORAL_TABLET | Freq: Four times a day (QID) | ORAL | Status: AC

## 2011-07-23 MED ORDER — LANOLIN HYDROUS EX OINT: TOPICAL_OINTMENT | CUTANEOUS | Status: DC | PRN

## 2011-07-23 MED ORDER — ONDANSETRON HCL 4 MG PO TABS: 4.0000 mg | ORAL_TABLET | ORAL | Status: DC | PRN

## 2011-07-23 MED ORDER — METHYLERGONOVINE MALEATE 0.2 MG/ML IJ SOLN
0.2000 mg | Freq: Once | INTRAMUSCULAR | Status: AC
  Administered 2011-07-23: 0.2 mg via INTRAMUSCULAR

## 2011-07-23 MED ORDER — ZOLPIDEM TARTRATE 5 MG PO TABS: 5.0000 mg | ORAL_TABLET | Freq: Every evening | ORAL | Status: DC | PRN

## 2011-07-23 MED ORDER — SENNOSIDES-DOCUSATE SODIUM 8.6-50 MG PO TABS: 2.0000 | ORAL_TABLET | Freq: Every day | ORAL | Status: DC

## 2011-07-23 MED ORDER — OXYCODONE-ACETAMINOPHEN 5-325 MG PO TABS
1.0000 | ORAL_TABLET | ORAL | Status: DC | PRN
  Administered 2011-07-23: 1 via ORAL
  Filled 2011-07-23: qty 1

## 2011-07-23 MED ORDER — ONDANSETRON HCL 4 MG/2ML IJ SOLN: 4.0000 mg | INTRAMUSCULAR | Status: DC | PRN

## 2011-07-23 NOTE — Discharge Summary (Signed)
Obstetric Discharge Summary Reason for Admission: fetal demise at 18 weeks for induction/delivery Prenatal Procedures: ultrasound Intrapartum Procedures: spontaneous vaginal delivery, uterine exploration and curettage Postpartum Procedures: none Complications-Operative and Postpartum: retained placenta required US guided removal Hemoglobin  Date Value Range Status  07/23/2011 10.2* 12.0-15.0 (g/dL) Final     DELTA CHECK NOTED     REPEATED TO VERIFY     HCT  Date Value Range Status  07/23/2011 28.9* 36.0-46.0 (%) Final    Discharge Diagnoses: Fetal Demise at 18 weeks delivered                                        Cytotec induction and vaginal delivery                                        Abdominal wall defect in fetus                                        Retained placenta removed with US guidance Discharge Information: Date: 07/23/2011 Activity: pelvic rest Diet: routine Medications: Ibuprophen Condition: improved Instructions: Nothing in vagina for 6 weeks                        Resume lovenox for 6 weeks postpartum today at 5 pm (07/23/11) Discharge to: home Follow-up Information    Follow up with Janice Bodine W. Make an appointment in 3 weeks.   Contact information:   [address], [address] [place] [address] [phone]           Oliver Pila 07/23/2011, 11:04 AM

## 2011-07-23 NOTE — Progress Notes (Signed)
UR chart review completed.  

## 2011-07-23 NOTE — Progress Notes (Signed)
Pt is discharged in the care of husband. Emotional support given,due to lost. the patient verbalized fears and anxieties.

## 2011-07-23 NOTE — Progress Notes (Signed)
Post Partum Day 1 Subjective: Pt feels better, less nausea.  Able to ambulate without problem, voiding fine.  Pain is controlled with motrin.  Vaginal bleeding minimal. Pt would like to go home this pm.  Objective: Blood pressure 106/73, pulse 66, temperature 98 F (36.7 C), temperature source Oral, resp. rate 18, height 5\' 11"  (1.803 m), weight 56.246 kg (124 lb), last menstrual period 03/17/2011, SpO2 100.00%.  Physical Exam:  General: alert Lochia: appropriate Uterine Fundus: firm  DVT Evaluation: No evidence of DVT seen on physical exam.   Basename 07/23/11 0545 07/22/11 1800  HGB 10.2* 12.9  HCT 28.9* 36.5    Assessment/Plan: Discharge home Instructed pt to restart her lovenox this pm at 5pm, which will be 12 hours+ post-epidural removal. d/w pt normal grieving and depression, info re: Heartstrings d/w her. Baby"s pathology seemed obvious with abdominal wall defect, so will not send TORCH w/u--pt has had nml chromosomes tested in past Ibuprofen for pain.  LOS: 1 day   Isaic Syler W 07/23/2011, 10:57 AM

## 2011-07-23 NOTE — Anesthesia Procedure Notes (Signed)
Epidural Patient location during procedure: OB Start time: 07/23/2011 12:01 AM  Staffing Performed by: anesthesiologist   Preanesthetic Checklist Completed: patient identified, site marked, surgical consent, pre-op evaluation, timeout performed, IV checked, risks and benefits discussed and monitors and equipment checked  Epidural Patient position: sitting Prep: site prepped and draped and DuraPrep Patient monitoring: continuous pulse ox and blood pressure Approach: midline Injection technique: LOR air and LOR saline  Needle:  Needle type: Tuohy  Needle gauge: 17 G Needle length: 9 cm Needle insertion depth: 5 cm cm Catheter type: closed end flexible Catheter size: 19 Gauge Catheter at skin depth: 10 cm Test dose: negative  Assessment Events: blood not aspirated, injection not painful, no injection resistance, negative IV test and no paresthesia  Additional Notes Patient identified.  Risk benefits discussed including failed block, incomplete pain control, headache, nerve damage, paralysis, blood pressure changes, nausea, vomiting, reactions to medication both toxic or allergic, and postpartum back pain.  Patient expressed understanding and wished to proceed.  All questions were answered.  Sterile technique used throughout procedure and epidural site dressed with sterile barrier dressing. No paresthesia or other complications noted.The patient did not experience any signs of intravascular injection such as tinnitus or metallic taste in mouth nor signs of intrathecal spread such as rapid motor block. Please see nursing notes for vital signs.

## 2011-07-23 NOTE — Anesthesia Postprocedure Evaluation (Signed)
Anesthesia Post Note  Patient: [name]  Procedure(s) Performed: * No procedures listed *  Anesthesia type: Epidural  Patient location: Mother/Baby  Post pain: Pain level controlled  Post assessment: Post-op Vital signs reviewed  Last Vitals: There were no vitals filed for this visit.  Post vital signs: Reviewed  Level of consciousness: awake  Complications: No apparent anesthesia complications

## 2011-07-26 ENCOUNTER — Encounter (HOSPITAL_COMMUNITY): Payer: Self-pay | Admitting: *Deleted

## 2011-07-27 ENCOUNTER — Other Ambulatory Visit (HOSPITAL_COMMUNITY): Payer: Self-pay | Admitting: *Deleted

## 2011-09-13 LAB — CBC
HCT: 31.2 — ABNORMAL LOW
HCT: 35.1 — ABNORMAL LOW
Hemoglobin: 11.7 — ABNORMAL LOW
MCHC: 33.3
MCHC: 33.9
MCV: 90.6
Platelets: 197
RBC: 3.89
RDW: 12.4

## 2011-09-22 ENCOUNTER — Telehealth: Payer: Self-pay | Admitting: Family Medicine

## 2011-09-22 ENCOUNTER — Ambulatory Visit (INDEPENDENT_AMBULATORY_CARE_PROVIDER_SITE_OTHER): Payer: BC Managed Care – PPO | Admitting: Family Medicine

## 2011-09-22 ENCOUNTER — Encounter: Payer: Self-pay | Admitting: Family Medicine

## 2011-09-22 VITALS — BP 136/85 | HR 75 | Temp 97.5°F | Ht 68.5 in | Wt 121.1 lb

## 2011-09-22 DIAGNOSIS — M542 Cervicalgia: Secondary | ICD-10-CM | POA: Insufficient documentation

## 2011-09-22 DIAGNOSIS — Z23 Encounter for immunization: Secondary | ICD-10-CM

## 2011-09-22 DIAGNOSIS — Z0000 Encounter for general adult medical examination without abnormal findings: Secondary | ICD-10-CM | POA: Insufficient documentation

## 2011-09-22 DIAGNOSIS — D689 Coagulation defect, unspecified: Secondary | ICD-10-CM

## 2011-09-22 MED ORDER — NAPROXEN SODIUM 220 MG PO TABS: 220.0000 mg | ORAL_TABLET | Freq: Every day | ORAL | Status: AC | PRN

## 2011-09-22 MED ORDER — CYCLOBENZAPRINE HCL 5 MG PO TABS: 5.0000 mg | ORAL_TABLET | Freq: Every evening | ORAL | Status: AC | PRN
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
## 2011-09-22 NOTE — Telephone Encounter (Signed)
Patient needs a copy of today's OV notes mailed to her

## 2011-09-22 NOTE — Patient Instructions (Signed)

## 2011-09-22 NOTE — Assessment & Plan Note (Signed)
She is given a flu shot today and tolerates it well. No other acute concerns. Wears a seat belt routinely

## 2011-09-22 NOTE — Assessment & Plan Note (Signed)
Factor V Leiden deficiency suffered a recent pregnancy demise. Came off of Lovenox in August.

## 2011-09-27 LAB — CBC
HCT: 38.1
Hemoglobin: 13
MCHC: 34.2
Platelets: 267
RDW: 12.8

## 2011-09-27 NOTE — Telephone Encounter (Signed)
Please print her recent note and mail to patient if permissible

## 2011-09-28 NOTE — Telephone Encounter (Signed)
sent 

## 2011-10-05 NOTE — Telephone Encounter (Signed)
Left a detailed message on patients voicemail stating that the last OV we mailed to her came back to [place]. I stated that the envelope would be at the front desk for patient to pick up.

## 2012-09-25 ENCOUNTER — Ambulatory Visit (INDEPENDENT_AMBULATORY_CARE_PROVIDER_SITE_OTHER): Payer: BC Managed Care – PPO

## 2012-09-25 DIAGNOSIS — Z23 Encounter for immunization: Secondary | ICD-10-CM

## 2012-12-13 NOTE — L&D Delivery Note (Signed)
Delivery Note Pt reached complete dilation and pushed about 23 minutes. At 11:45 AM a healthy female was delivered via Vaginal, Spontaneous Delivery (Presentation: Right Occiput Anterior).  APGAR: 8, 9; weight pending .   Placenta status: Intact, Spontaneous Pathology.  Cord: 3 vessels with the following complications: None.    Anesthesia: Epidural  Episiotomy: None Lacerations: 2nd degree;Perineal Suture Repair: 3.0 vicryl rapide Est. Blood Loss (mL): 300cc  Mom to postpartum.  Baby to stay with mother.  Oliver Pila 09/10/2013, 12:07 PM

## 2013-03-21 LAB — OB RESULTS CONSOLE GC/CHLAMYDIA
Chlamydia: NEGATIVE
Gonorrhea: NEGATIVE

## 2013-03-21 LAB — OB RESULTS CONSOLE HEPATITIS B SURFACE ANTIGEN: Hepatitis B Surface Ag: NEGATIVE

## 2013-03-21 LAB — OB RESULTS CONSOLE RUBELLA ANTIBODY, IGM: Rubella: NON-IMMUNE/NOT IMMUNE

## 2013-03-21 LAB — OB RESULTS CONSOLE ANTIBODY SCREEN: Antibody Screen: NEGATIVE

## 2013-09-07 ENCOUNTER — Telehealth (HOSPITAL_COMMUNITY): Payer: Self-pay | Admitting: *Deleted

## 2013-09-07 ENCOUNTER — Encounter (HOSPITAL_COMMUNITY): Payer: Self-pay | Admitting: *Deleted

## 2013-09-07 NOTE — Telephone Encounter (Signed)
Preadmission screen  

## 2013-09-09 NOTE — H&P (Signed)
[name] is a 32 y.o. female [id number] at 24 3/7 weeks (EDD 09/28/13 by LMP c/w 10 week [place])  presenting for IOL per MFM recommendation (d/w Dr. Sherrie George 09/06/13).  Pt has a poor obstetrical history with 3 first trimester SAB's and also a fetal demise at 17 weeks with a ventral wall defect.  This pregnancy has been followed closely with waning growth [place] and the most recent 09/06/13 showed the EFW at 11%ile and the AFI 9.  NST was reassuring.  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  Maternal Medical History:  Contractions: Frequency: irregular.   Perceived severity is mild.    Fetal activity: Perceived fetal activity is normal.    Prenatal complications: IUGR.   Prenatal Complications - Diabetes: none.    OB History   Grav Para Term Preterm Abortions TAB SAB Ect Mult Living   6 2 1  0 3 0 3 0 0 1    SAB x 3  NSVD 6#9oz 2009 NSVD 17 week demise with a ventral wall defect   Past Medical History  Diagnosis Date  . Clotting disorder     factor 5  . Chicken pox as a child  . Blood transfusion   . No pertinent past medical history   . Factor V Leiden mutation complicating pregnancy    Past Surgical History  Procedure Laterality Date  . Dilation and curettage of uterus  2008   Family History: family history includes Depression in her sister; Diabetes in her father; Heart disease in her father; Kidney disease in her father; Lung cancer in her sister and sister; Thyroid disease in her sister. Social History:  reports that she has never smoked. She does not have any smokeless tobacco history on file. She reports that she does not drink alcohol or use illicit drugs.   Prenatal Transfer Tool  Maternal Diabetes: No Genetic Screening: Normal Maternal Ultrasounds/Referrals: Abnormal:  Findings:   IUGR  11%ile  and AFI 9 Fetal Ultrasounds or other Referrals:  None Maternal Substance Abuse:  No Significant Maternal Medications:  Meds include: Other: Heparin Significant Maternal Lab Results:  None Other Comments:  None  ROS    Last menstrual period 12/22/2012. Maternal Exam:  Uterine Assessment: Contraction strength is mild.  Contraction frequency is irregular.   Abdomen: Patient reports no abdominal tenderness. Fetal presentation: vertex  Introitus: Normal vulva. Normal vagina.  Pelvis: adequate for delivery.      Physical Exam  Constitutional: She is oriented to person, place, and time. She appears well-developed and well-nourished.  Cardiovascular: Normal rate and regular rhythm.   Respiratory: Effort normal.  GI: Soft.  Genitourinary: Vagina normal and uterus normal.  Neurological: She is alert and oriented to person, place, and time.  Psychiatric: She has a normal mood and affect. Her behavior is normal.    Prenatal labs: ABO, Rh: O/--/-- (04/09 0000) Antibody:  Negative Rubella: Nonimmune (04/09 0000) RPR: Nonreactive (04/09 0000)  HBsAg: Negative (04/09 0000)  HIV: Non-reactive (04/09 0000)  GBS: Negative (09/12 0000)  First trimester screen and AFP WNL One hour GTT 98  Assessment/Plan: Pt is admitted for induction of labor per MFM recommendations for IUGR and poor obsetrical history.  WIll proceed with pitocin and AROM.   Oliver Pila 09/09/2013, 3:42 PM

## 2013-09-10 ENCOUNTER — Inpatient Hospital Stay (HOSPITAL_COMMUNITY)
Admission: RE | Admit: 2013-09-10 | Discharge: 2013-09-12 | DRG: 373 | Disposition: A | Discharge status: Home / Self Care | Payer: BC Managed Care – PPO | Source: Ambulatory Visit | Attending: Obstetrics and Gynecology | Admitting: Obstetrics and Gynecology

## 2013-09-10 ENCOUNTER — Inpatient Hospital Stay (HOSPITAL_COMMUNITY): Payer: BC Managed Care – PPO | Admitting: Anesthesiology

## 2013-09-10 ENCOUNTER — Encounter (HOSPITAL_COMMUNITY): Payer: Self-pay

## 2013-09-10 ENCOUNTER — Encounter (HOSPITAL_COMMUNITY): Payer: Self-pay | Admitting: Anesthesiology

## 2013-09-10 VITALS — BP 108/75 | HR 66 | Temp 98.2°F | Resp 18 | Ht 70.5 in | Wt 148.0 lb

## 2013-09-10 DIAGNOSIS — O365990 Maternal care for other known or suspected poor fetal growth, unspecified trimester, not applicable or unspecified: Secondary | ICD-10-CM | POA: Diagnosis present

## 2013-09-10 DIAGNOSIS — D689 Coagulation defect, unspecified: Secondary | ICD-10-CM | POA: Diagnosis present

## 2013-09-10 DIAGNOSIS — D6859 Other primary thrombophilia: Secondary | ICD-10-CM | POA: Diagnosis present

## 2013-09-10 LAB — CBC
Hemoglobin: 11.8 g/dL — ABNORMAL LOW (ref 12.0–15.0)
MCV: 87.3 fL (ref 78.0–100.0)
Platelets: 234 10*3/uL (ref 150–400)
RBC: 3.86 MIL/uL — ABNORMAL LOW (ref 3.87–5.11)
RDW: 12.7 % (ref 11.5–15.5)
WBC: 11.7 10*3/uL — ABNORMAL HIGH (ref 4.0–10.5)

## 2013-09-10 LAB — TYPE AND SCREEN: ABO/RH(D): O POS

## 2013-09-10 LAB — ABO/RH: ABO/RH(D): O POS

## 2013-09-10 LAB — RPR: RPR Ser Ql: NONREACTIVE

## 2013-09-10 MED ORDER — DIBUCAINE 1 % RE OINT: 1.0000 "application " | TOPICAL_OINTMENT | RECTAL | Status: DC | PRN

## 2013-09-10 MED ORDER — DIPHENHYDRAMINE HCL 50 MG/ML IJ SOLN: 12.5000 mg | INTRAMUSCULAR | Status: DC | PRN

## 2013-09-10 MED ORDER — CITRIC ACID-SODIUM CITRATE 334-500 MG/5ML PO SOLN: 30.0000 mL | ORAL | Status: DC | PRN

## 2013-09-10 MED ORDER — HEPARIN SODIUM (PORCINE) 5000 UNIT/ML IJ SOLN
5000.0000 [IU] | Freq: Two times a day (BID) | INTRAMUSCULAR | Status: DC
  Administered 2013-09-11 – 2013-09-12 (×2): 5000 [IU] via SUBCUTANEOUS
  Filled 2013-09-10 (×2): qty 1

## 2013-09-10 MED ORDER — OXYTOCIN BOLUS FROM INFUSION
500.0000 mL | INTRAVENOUS | Status: DC
  Administered 2013-09-10: 500 mL via INTRAVENOUS

## 2013-09-10 MED ORDER — EPHEDRINE 5 MG/ML INJ
10.0000 mg | INTRAVENOUS | Status: DC | PRN
  Filled 2013-09-10: qty 2

## 2013-09-10 MED ORDER — TERBUTALINE SULFATE 1 MG/ML IJ SOLN: 0.2500 mg | Freq: Once | INTRAMUSCULAR | Status: DC | PRN

## 2013-09-10 MED ORDER — LACTATED RINGERS IV SOLN
500.0000 mL | Freq: Once | INTRAVENOUS | Status: AC
  Administered 2013-09-10: 500 mL via INTRAVENOUS

## 2013-09-10 MED ORDER — OXYTOCIN 40 UNITS IN LACTATED RINGERS INFUSION - SIMPLE MED: 62.5000 mL/h | INTRAVENOUS | Status: DC

## 2013-09-10 MED ORDER — ACETAMINOPHEN 325 MG PO TABS: 650.0000 mg | ORAL_TABLET | ORAL | Status: DC | PRN

## 2013-09-10 MED ORDER — ZOLPIDEM TARTRATE 5 MG PO TABS: 5.0000 mg | ORAL_TABLET | Freq: Every evening | ORAL | Status: DC | PRN

## 2013-09-10 MED ORDER — TETANUS-DIPHTH-ACELL PERTUSSIS 5-2.5-18.5 LF-MCG/0.5 IM SUSP: 0.5000 mL | Freq: Once | INTRAMUSCULAR | Status: DC

## 2013-09-10 MED ORDER — EPHEDRINE 5 MG/ML INJ
10.0000 mg | INTRAVENOUS | Status: DC | PRN
  Filled 2013-09-10: qty 4
  Filled 2013-09-10: qty 2

## 2013-09-10 MED ORDER — LACTATED RINGERS IV SOLN
INTRAVENOUS | Status: DC
  Administered 2013-09-10: 09:00:00 via INTRAVENOUS

## 2013-09-10 MED ORDER — LIDOCAINE HCL (PF) 1 % IJ SOLN
30.0000 mL | INTRAMUSCULAR | Status: DC | PRN
  Filled 2013-09-10 (×2): qty 30

## 2013-09-10 MED ORDER — FENTANYL 2.5 MCG/ML BUPIVACAINE 1/10 % EPIDURAL INFUSION (WH - ANES)
INTRAMUSCULAR | Status: DC | PRN
Start: 2013-09-10 — End: 2013-09-10
  Administered 2013-09-10: 15 mL/h via EPIDURAL

## 2013-09-10 MED ORDER — ONDANSETRON HCL 4 MG/2ML IJ SOLN: 4.0000 mg | INTRAMUSCULAR | Status: DC | PRN

## 2013-09-10 MED ORDER — LACTATED RINGERS IV SOLN
500.0000 mL | INTRAVENOUS | Status: DC | PRN
  Administered 2013-09-10: 300 mL via INTRAVENOUS

## 2013-09-10 MED ORDER — PHENYLEPHRINE 40 MCG/ML (10ML) SYRINGE FOR IV PUSH (FOR BLOOD PRESSURE SUPPORT)
80.0000 ug | PREFILLED_SYRINGE | INTRAVENOUS | Status: DC | PRN
  Filled 2013-09-10: qty 5
  Filled 2013-09-10: qty 2

## 2013-09-10 MED ORDER — FENTANYL 2.5 MCG/ML BUPIVACAINE 1/10 % EPIDURAL INFUSION (WH - ANES)
14.0000 mL/h | INTRAMUSCULAR | Status: DC | PRN
  Filled 2013-09-10: qty 125

## 2013-09-10 MED ORDER — PHENYLEPHRINE 40 MCG/ML (10ML) SYRINGE FOR IV PUSH (FOR BLOOD PRESSURE SUPPORT)
80.0000 ug | PREFILLED_SYRINGE | INTRAVENOUS | Status: DC | PRN
  Filled 2013-09-10: qty 2

## 2013-09-10 MED ORDER — IBUPROFEN 600 MG PO TABS
600.0000 mg | ORAL_TABLET | Freq: Four times a day (QID) | ORAL | Status: DC | PRN
  Administered 2013-09-10: 600 mg via ORAL
  Filled 2013-09-10: qty 1

## 2013-09-10 MED ORDER — ONDANSETRON HCL 4 MG PO TABS: 4.0000 mg | ORAL_TABLET | ORAL | Status: DC | PRN

## 2013-09-10 MED ORDER — WITCH HAZEL-GLYCERIN EX PADS: 1.0000 "application " | MEDICATED_PAD | CUTANEOUS | Status: DC | PRN

## 2013-09-10 MED ORDER — BENZOCAINE-MENTHOL 20-0.5 % EX AERO: 1.0000 "application " | INHALATION_SPRAY | CUTANEOUS | Status: DC | PRN

## 2013-09-10 MED ORDER — SENNOSIDES-DOCUSATE SODIUM 8.6-50 MG PO TABS
2.0000 | ORAL_TABLET | ORAL | Status: DC
  Administered 2013-09-11: 2 via ORAL

## 2013-09-10 MED ORDER — OXYCODONE-ACETAMINOPHEN 5-325 MG PO TABS
1.0000 | ORAL_TABLET | ORAL | Status: DC | PRN
  Administered 2013-09-11 (×2): 1 via ORAL
  Filled 2013-09-10 (×3): qty 1

## 2013-09-10 MED ORDER — DIPHENHYDRAMINE HCL 25 MG PO CAPS: 25.0000 mg | ORAL_CAPSULE | Freq: Four times a day (QID) | ORAL | Status: DC | PRN

## 2013-09-10 MED ORDER — IBUPROFEN 600 MG PO TABS
600.0000 mg | ORAL_TABLET | Freq: Four times a day (QID) | ORAL | Status: DC
  Administered 2013-09-10 – 2013-09-12 (×7): 600 mg via ORAL
  Filled 2013-09-10 (×8): qty 1

## 2013-09-10 MED ORDER — OXYCODONE-ACETAMINOPHEN 5-325 MG PO TABS: 1.0000 | ORAL_TABLET | ORAL | Status: DC | PRN

## 2013-09-10 MED ORDER — LIDOCAINE HCL (PF) 1 % IJ SOLN
INTRAMUSCULAR | Status: DC | PRN
  Administered 2013-09-10 (×2): 5 mL

## 2013-09-10 MED ORDER — OXYTOCIN 40 UNITS IN LACTATED RINGERS INFUSION - SIMPLE MED
1.0000 m[IU]/min | INTRAVENOUS | Status: DC
  Administered 2013-09-10: 2 m[IU]/min via INTRAVENOUS
  Filled 2013-09-10: qty 1000

## 2013-09-10 MED ORDER — PRENATAL MULTIVITAMIN CH
1.0000 | ORAL_TABLET | Freq: Every day | ORAL | Status: DC
  Administered 2013-09-11 – 2013-09-12 (×2): 1 via ORAL
  Filled 2013-09-10 (×2): qty 1

## 2013-09-10 MED ORDER — ONDANSETRON HCL 4 MG/2ML IJ SOLN: 4.0000 mg | Freq: Four times a day (QID) | INTRAMUSCULAR | Status: DC | PRN

## 2013-09-10 MED ORDER — LANOLIN HYDROUS EX OINT: TOPICAL_OINTMENT | CUTANEOUS | Status: DC | PRN

## 2013-09-10 MED ORDER — SIMETHICONE 80 MG PO CHEW: 80.0000 mg | CHEWABLE_TABLET | ORAL | Status: DC | PRN

## 2013-09-10 NOTE — Progress Notes (Signed)
Patient ID: [name], female   DOB: December 17, 1980, 32 y.o.   MRN: [id number] Pt getting comfortable with epidural, but still with pressure 80/7/-1  Making quick progress. Expect vaginal delivery

## 2013-09-10 NOTE — Anesthesia Preprocedure Evaluation (Signed)
Anesthesia Evaluation  Patient identified by MRN, date of birth, ID band Patient awake    Reviewed: Allergy & Precautions, H&P , NPO status , Patient's Chart, lab work & pertinent test results  Airway Mallampati: II TM Distance: >3 FB Neck ROM: Full    Dental no notable dental hx. (+) Teeth Intact   Pulmonary neg pulmonary ROS,  breath sounds clear to auscultation  Pulmonary exam normal       Cardiovascular negative cardio ROS  Rhythm:Regular Rate:Normal     Neuro/Psych negative neurological ROS  negative psych ROS   GI/Hepatic negative GI ROS, Neg liver ROS,   Endo/Other  negative endocrine ROS  Renal/GU negative Renal ROS  negative genitourinary   Musculoskeletal   Abdominal   Peds  Hematology  (+) Blood dyscrasia, , Factor V Leiden    Anesthesia Other Findings   Reproductive/Obstetrics                           Anesthesia Physical Anesthesia Plan  ASA: II  Anesthesia Plan: Epidural   Post-op Pain Management:    Induction:   Airway Management Planned: Natural Airway  Additional Equipment:   Intra-op Plan:   Post-operative Plan:   Informed Consent: I have reviewed the patients History and Physical, chart, labs and discussed the procedure including the risks, benefits and alternatives for the proposed anesthesia with the patient or authorized representative who has indicated his/her understanding and acceptance.   Dental advisory given  Plan Discussed with: Anesthesiologist  Anesthesia Plan Comments:         Anesthesia Quick Evaluation

## 2013-09-10 NOTE — Anesthesia Postprocedure Evaluation (Signed)
  Anesthesia Post-op Note  Patient: [name]  Procedure(s) Performed: * No procedures listed *  Patient Location: Mother/Baby  Anesthesia Type:Epidural  Level of Consciousness: awake, alert , oriented and patient cooperative  Airway and Oxygen Therapy: Patient Spontanous Breathing  Post-op Pain: mild  Post-op Assessment: Patient's Cardiovascular Status Stable, Respiratory Function Stable, Patent Airway, No signs of Nausea or vomiting, Adequate PO intake and Pain level controlled  Post-op Vital Signs: Reviewed and stable  Complications: No apparent anesthesia complications

## 2013-09-10 NOTE — Progress Notes (Signed)
Patient ID: [name], female   DOB: Mar 22, 1981, 32 y.o.   MRN: [id number] Pt came in this AM with SROM at 400am.  She was begun on her pitocin and is now getting uncomfortable and requesting epidural. FHR reactive Cervix 50/4  Per RN Receiving epidural. Will follow progress.

## 2013-09-10 NOTE — Anesthesia Procedure Notes (Signed)
Epidural Patient location during procedure: OB Start time: 09/10/2013 9:04 AM  Staffing Anesthesiologist: Caulder Wehner A. Performed by: anesthesiologist   Preanesthetic Checklist Completed: patient identified, site marked, surgical consent, pre-op evaluation, timeout performed, IV checked, risks and benefits discussed and monitors and equipment checked  Epidural Patient position: sitting Prep: site prepped and draped and DuraPrep Patient monitoring: continuous pulse ox and blood pressure Approach: midline Injection technique: LOR air  Needle:  Needle type: Tuohy  Needle gauge: 17 G Needle length: 9 cm and 9 Needle insertion depth: 4 cm Catheter type: closed end flexible Catheter size: 19 Gauge Catheter at skin depth: 9 cm Test dose: negative and Other  Assessment Events: blood not aspirated, injection not painful, no injection resistance, negative IV test and no paresthesia  Additional Notes Patient identified. Risks and benefits discussed including failed block, incomplete  Pain control, post dural puncture headache, nerve damage, paralysis, blood pressure Changes, nausea, vomiting, reactions to medications-both toxic and allergic and post Partum back pain. All questions were answered. Patient expressed understanding and wished to proceed. Sterile technique was used throughout procedure. Epidural site was Dressed with sterile barrier dressing. No paresthesias, signs of intravascular injection Or signs of intrathecal spread were encountered.  Patient was more comfortable after the epidural was dosed. Please see RN's note for documentation of vital signs and FHR which are stable.

## 2013-09-11 LAB — CBC
Hemoglobin: 10.9 g/dL — ABNORMAL LOW (ref 12.0–15.0)
MCH: 31.2 pg (ref 26.0–34.0)
MCHC: 35.6 g/dL (ref 30.0–36.0)
MCV: 87.7 fL (ref 78.0–100.0)
Platelets: 199 10*3/uL (ref 150–400)
RBC: 3.49 MIL/uL — ABNORMAL LOW (ref 3.87–5.11)
RDW: 12.6 % (ref 11.5–15.5)

## 2013-09-11 LAB — CREATININE, SERUM: Creatinine, Ser: 0.55 mg/dL (ref 0.50–1.10)

## 2013-09-11 NOTE — Progress Notes (Signed)
Post Partum Day 1 Subjective: no complaints, up ad lib and tolerating PO  Objective: Blood pressure 105/65, pulse 71, temperature 97.3 F (36.3 C), temperature source Oral, resp. rate 18, height 5' 10.5" (1.791 m), weight 67.132 kg (148 lb), last menstrual period 12/22/2012, SpO2 100.00%, unknown if currently breastfeeding.  Physical Exam:  General: alert and cooperative Lochia: appropriate Uterine Fundus: firm    Recent Labs  09/10/13 0635 09/11/13 0550  HGB 11.8* 10.9*  HCT 33.7* 30.6*    Assessment/Plan: Plan for discharge tomorrow   LOS: 1 day   Kc Sedlak W 09/11/2013, 8:01 AM

## 2013-09-12 MED ORDER — OXYCODONE-ACETAMINOPHEN 5-325 MG PO TABS: 1.0000 | ORAL_TABLET | ORAL | Status: DC | PRN

## 2013-09-12 MED ORDER — MEASLES, MUMPS & RUBELLA VAC ~~LOC~~ INJ
0.5000 mL | INJECTION | Freq: Once | SUBCUTANEOUS | Status: AC
  Administered 2013-09-12: 0.5 mL via SUBCUTANEOUS
  Filled 2013-09-12: qty 0.5

## 2013-09-12 MED ORDER — IBUPROFEN 600 MG PO TABS: 600.0000 mg | ORAL_TABLET | Freq: Four times a day (QID) | ORAL | Status: DC

## 2013-09-12 NOTE — Discharge Summary (Signed)
Obstetric Discharge Summary Reason for Admission: rupture of membranes Prenatal Procedures: ultrasound Intrapartum Procedures: spontaneous vaginal delivery Postpartum Procedures: Heparin BID resumed 24 hours after epidural removed Complications-Operative and Postpartum: second degree perineal laceration Hemoglobin  Date Value Range Status  09/11/2013 10.9* 12.0 - 15.0 g/dL Final     HCT  Date Value Range Status  09/11/2013 30.6* 36.0 - 46.0 % Final    Physical Exam:  General: alert and cooperative Lochia: appropriate Uterine Fundus: firm   Discharge Diagnoses: Term Pregnancy-delivered  Discharge Information: Date: 09/12/2013 Activity: pelvic rest Diet: routine Medications: Ibuprofen, Percocet and Heparin  Condition: improved Instructions: refer to practice specific booklet Discharge to: home Follow-up Information   Follow up with Oliver Pila, MD. Schedule an appointment as soon as possible for a visit in 6 weeks. (postpartum)    Specialty:  Obstetrics and Gynecology   Contact information:   [address], [address] [place] [address] [phone]       Newborn Data: Live born female  Birth Weight: 6 lb 1.2 oz (2755 g) APGAR: 8, 9  Home with mother.  Oliver Pila 09/12/2013, 8:33 AM

## 2013-09-12 NOTE — Progress Notes (Signed)
Post Partum Day 2 Subjective: no complaints, up ad lib and tolerating PO  Objective: Blood pressure 108/75, pulse 66, temperature 98.2 F (36.8 C), temperature source Oral, resp. rate 18, height 5' 10.5" (1.791 m), weight 67.132 kg (148 lb), last menstrual period 12/22/2012, SpO2 100.00%, unknown if currently breastfeeding.  Physical Exam:  General: alert and cooperative Lochia: appropriate Uterine Fundus: firm    Recent Labs  09/10/13 0635 09/11/13 0550  HGB 11.8* 10.9*  HCT 33.7* 30.6*    Assessment/Plan: Discharge home   LOS: 2 days   Phillip Maffei W 09/12/2013, 8:31 AM

## 2013-10-24 ENCOUNTER — Other Ambulatory Visit (HOSPITAL_COMMUNITY): Payer: Self-pay | Admitting: Obstetrics and Gynecology

## 2013-10-24 ENCOUNTER — Other Ambulatory Visit (HOSPITAL_COMMUNITY): Payer: BC Managed Care – PPO

## 2013-10-24 DIAGNOSIS — R509 Fever, unspecified: Secondary | ICD-10-CM

## 2013-10-25 ENCOUNTER — Ambulatory Visit (INDEPENDENT_AMBULATORY_CARE_PROVIDER_SITE_OTHER): Payer: BC Managed Care – PPO | Admitting: Infectious Diseases

## 2013-10-25 ENCOUNTER — Ambulatory Visit (HOSPITAL_COMMUNITY)
Admission: RE | Admit: 2013-10-25 | Discharge: 2013-10-25 | Disposition: A | Discharge status: Home / Self Care | Payer: BC Managed Care – PPO | Source: Ambulatory Visit | Attending: Obstetrics and Gynecology | Admitting: Obstetrics and Gynecology

## 2013-10-25 ENCOUNTER — Encounter: Payer: Self-pay | Admitting: Infectious Diseases

## 2013-10-25 VITALS — BP 119/77 | HR 91 | Temp 98.1°F | Ht 70.5 in | Wt 135.0 lb

## 2013-10-25 DIAGNOSIS — R509 Fever, unspecified: Secondary | ICD-10-CM

## 2013-10-25 DIAGNOSIS — J90 Pleural effusion, not elsewhere classified: Secondary | ICD-10-CM | POA: Insufficient documentation

## 2013-10-25 DIAGNOSIS — O864 Pyrexia of unknown origin following delivery: Secondary | ICD-10-CM | POA: Insufficient documentation

## 2013-10-25 DIAGNOSIS — J189 Pneumonia, unspecified organism: Secondary | ICD-10-CM

## 2013-10-25 MED ORDER — IOHEXOL 300 MG/ML  SOLN
80.0000 mL | Freq: Once | INTRAMUSCULAR | Status: AC | PRN
  Administered 2013-10-25: 80 mL via INTRAVENOUS

## 2013-10-25 MED ORDER — LEVOFLOXACIN 500 MG PO TABS: 500.0000 mg | ORAL_TABLET | Freq: Every day | ORAL | Status: DC

## 2013-10-25 NOTE — Assessment & Plan Note (Signed)
I discussed her film with radiology. She has pneumonia. I would suggest that this is a function of her being less upright and less mobile post partum. Will give her a trial for 10 days of levaquin. She will call if she does not feel improved or if she has more fever.  She does have a history of clotting disorder, will hold in the back of my mind that she could have lung issue related to this. She has not previously had issues with clotting.

## 2013-10-25 NOTE — Progress Notes (Signed)
  Subjective:    Patient ID: [name], female    DOB: 04-Apr-1981, 32 y.o.   MRN: [id number]  HPI 32 yo F who had healthy boy by vaginal delivery on 09-10-13 and who has had fever for the last 2 weeks. She has had headaches and fatigue as well. Has been having sweats and chills at night as well. Has taken ibuprofen which has broken her fever but given her sweats.  CT- consolidative changes LLL, small amt fluid in pelvis, no abscess.  Has factor V lieden. Has been off heparin since Nov 3.  No hx of TB exposure.  Bottle feeding.   Review of Systems  Constitutional: Positive for fever and chills. Negative for appetite change and unexpected weight change.  Respiratory: Negative for cough and shortness of breath.   Gastrointestinal: Negative for diarrhea and constipation.  Genitourinary: Negative for difficulty urinating.  Neurological: Positive for headaches.  Has chalky taste since CT scan.      Objective:   Physical Exam  Constitutional: She appears well-developed and well-nourished.  HENT:  Mouth/Throat: No oropharyngeal exudate.  Eyes: EOM are normal. Pupils are equal, round, and reactive to light.  Neck: Neck supple.  Cardiovascular: Normal rate, regular rhythm and normal heart sounds.   Pulmonary/Chest: Effort normal.    Abdominal: Soft. Bowel sounds are normal. She exhibits no distension. There is no tenderness. There is no rebound and no guarding.  Musculoskeletal: She exhibits no edema.  Lymphadenopathy:    She has no cervical adenopathy.  Psychiatric:  Easily tearful          Assessment & Plan:

## 2013-11-26 ENCOUNTER — Ambulatory Visit (HOSPITAL_BASED_OUTPATIENT_CLINIC_OR_DEPARTMENT_OTHER)
Admission: RE | Admit: 2013-11-26 | Discharge: 2013-11-26 | Disposition: A | Discharge status: Home / Self Care | Payer: BC Managed Care – PPO | Source: Ambulatory Visit | Attending: Physician Assistant | Admitting: Physician Assistant

## 2013-11-26 ENCOUNTER — Ambulatory Visit (INDEPENDENT_AMBULATORY_CARE_PROVIDER_SITE_OTHER): Payer: BC Managed Care – PPO | Admitting: Physician Assistant

## 2013-11-26 VITALS — BP 118/70 | HR 72 | Temp 98.4°F | Resp 18 | Wt 139.0 lb

## 2013-11-26 DIAGNOSIS — R05 Cough: Secondary | ICD-10-CM | POA: Insufficient documentation

## 2013-11-26 DIAGNOSIS — R059 Cough, unspecified: Secondary | ICD-10-CM | POA: Insufficient documentation

## 2013-11-26 DIAGNOSIS — J189 Pneumonia, unspecified organism: Secondary | ICD-10-CM

## 2013-11-26 DIAGNOSIS — J9801 Acute bronchospasm: Secondary | ICD-10-CM

## 2013-11-26 MED ORDER — HYDROCOD POLST-CHLORPHEN POLST 10-8 MG/5ML PO LQCR: 5.0000 mL | Freq: Two times a day (BID) | ORAL | Status: DC | PRN

## 2013-11-26 NOTE — Patient Instructions (Signed)
Please go downstairs to obtain X-ray.  Increase fluid intake.  tussionex for cough.  Use Advair -- 2 puffs every 12 hours, while symptomatic.  Rest.  Probiotic.  Place a humidifier in bedroom.  I will call you with your results and we will begin another antibiotic if needed.

## 2013-11-26 NOTE — Progress Notes (Signed)
Called and left detailed message for patient letting her know that her CXR results were normal and to call our office if her symptoms do not begin to improve. JG//CMA

## 2013-12-04 ENCOUNTER — Encounter: Payer: Self-pay | Admitting: Physician Assistant

## 2013-12-04 DIAGNOSIS — J189 Pneumonia, unspecified organism: Secondary | ICD-10-CM | POA: Insufficient documentation

## 2013-12-04 NOTE — Progress Notes (Signed)
Patient ID: [name], female   DOB: 01-22-1981, 32 y.o.   MRN: [id number]  Patient presents to clinic today complaining of persistent cough that has been present since diagnosis of pneumonia on 10/24/2013. Patient has finished course of Levaquin. Notes that the cough went from productive to nonproductive, but has been persistent. Patient denies history of asthma. Does endorse mild seasonal allergies. Denies fever, chills, sweats, recent travel. Denies history of acid reflux. Denies halitosis, globus or indigestion. Patient has noticed some chest wall tenderness secondary to cough, but denies pleuritic chest pain.  Denies shortness of breath or wheezing. Vital signs in clinic are good today.  Past Medical History  Diagnosis Date  . Clotting disorder     factor 5  . Chicken pox as a child  . Blood transfusion   . No pertinent past medical history   . Factor V Leiden mutation complicating pregnancy     Current Outpatient Prescriptions on File Prior to Visit  Medication Sig Dispense Refill  . ibuprofen (ADVIL,MOTRIN) 600 MG tablet Take 1 tablet (600 mg total) by mouth every 6 (six) hours.  30 tablet  0  . Multiple Vitamins-Minerals (ADULT GUMMY PO) Take 1 tablet by mouth daily.      Marland Kitchen levofloxacin (LEVAQUIN) 500 MG tablet Take 1 tablet (500 mg total) by mouth daily.  10 tablet  0   No current facility-administered medications on file prior to visit.    Allergies  Allergen Reactions  . Sulfonamide Derivatives     REACTION: rash    Family History  Problem Relation Age of Onset  . Lung cancer Sister   . Lung cancer Sister   . Kidney disease Father     dialysis X 3  . Heart disease Father   . Diabetes Father   . Thyroid disease Sister   . Depression Sister     History   Social History  . Marital Status: Married    Spouse Name: N/A    Number of Children: 1  . Years of Education: N/A   Occupational History  . CLAIMS REPRESENTATIVE    Social History Main Topics  .  Smoking status: Never Smoker   . Smokeless tobacco: Not on file  . Alcohol Use: No  . Drug Use: No  . Sexual Activity: Yes    Partners: Male   Other Topics Concern  . Not on file   Social History Narrative  . No narrative on file   Review of Systems - See HPI.  All other ROS are negative.  Filed Vitals:   11/26/13 1446  BP: 118/70  Pulse: 72  Temp: 98.4 F (36.9 C)  Resp: 18   Physical Exam  Constitutional: She is oriented to person, place, and time and well-developed, well-nourished, and in no distress.  HENT:  Head: Normocephalic and atraumatic.  Right Ear: External ear normal.  Left Ear: External ear normal.  Nose: Nose normal.  Mouth/Throat: Oropharynx is clear and moist. No oropharyngeal exudate.  TM within normal limits bilaterally.  Eyes: Conjunctivae and EOM are normal. Pupils are equal, round, and reactive to light.  Neck: Neck supple.  Cardiovascular: Normal rate, regular rhythm, normal heart sounds and intact distal pulses.   Pulmonary/Chest: Effort normal. No respiratory distress. She has wheezes. She has no rales. She exhibits no tenderness.  Lymphadenopathy:    She has no cervical adenopathy.  Neurological: She is alert and oriented to person, place, and time. No cranial nerve deficit.  Skin: Skin is warm  and dry. No rash noted.  Psychiatric: Affect normal.    Recent Results (from the past 2160 hour(s))  CBC     Status: Abnormal   Collection Time    09/10/13  6:35 AM      Result Value Range   WBC 11.7 (*) 4.0 - 10.5 K/uL   RBC 3.86 (*) 3.87 - 5.11 MIL/uL   Hemoglobin 11.8 (*) 12.0 - 15.0 g/dL   HCT 30.8 (*) 65.7 - 84.6 %   MCV 87.3  78.0 - 100.0 fL   MCH 30.6  26.0 - 34.0 pg   MCHC 35.0  30.0 - 36.0 g/dL   RDW 96.2  95.2 - 84.1 %   Platelets 234  150 - 400 K/uL  RPR     Status: None   Collection Time    09/10/13  6:35 AM      Result Value Range   RPR NON REACTIVE  NON REACTIVE   Comment: Performed at [facility]  TYPE AND SCREEN      Status: None   Collection Time    09/10/13  6:35 AM      Result Value Range   ABO/RH(D) O POS     Antibody Screen NEG     Sample Expiration 09/13/2013    ABO/RH     Status: None   Collection Time    09/10/13  6:35 AM      Result Value Range   ABO/RH(D) O POS    CBC     Status: Abnormal   Collection Time    09/11/13  5:50 AM      Result Value Range   WBC 10.6 (*) 4.0 - 10.5 K/uL   RBC 3.49 (*) 3.87 - 5.11 MIL/uL   Hemoglobin 10.9 (*) 12.0 - 15.0 g/dL   HCT 32.4 (*) 40.1 - 02.7 %   MCV 87.7  78.0 - 100.0 fL   MCH 31.2  26.0 - 34.0 pg   MCHC 35.6  30.0 - 36.0 g/dL   RDW 25.3  66.4 - 40.3 %   Platelets 199  150 - 400 K/uL  CREATININE, SERUM     Status: None   Collection Time    09/11/13  5:50 AM      Result Value Range   Creatinine, Ser 0.55  0.50 - 1.10 mg/dL   GFR calc non Af Amer >90  >90 mL/min   GFR calc Af Amer >90  >90 mL/min   Comment: (NOTE)     The eGFR has been calculated using the CKD EPI equation.     This calculation has not been validated in all clinical situations.     eGFR's persistently <90 mL/min signify possible Chronic Kidney     Disease.    Assessment/Plan: CAP (community acquired pneumonia) Recent diagnosis.  Finished Levaquin.  Will obtain repeat CXr giving persistent cough.  Rx Tussionex.  Increase fluid intake. Will initiate antibiotics pending imaging results.  If workup negative and cough persists, will need to assess for other causes of chronic cough.  Given sample of advair for bronchospasm and chest tightness.

## 2013-12-04 NOTE — Assessment & Plan Note (Addendum)
Recent diagnosis.  Finished Levaquin.  Will obtain repeat CXr giving persistent cough.  Rx Tussionex.  Increase fluid intake. Will initiate antibiotics pending imaging results.  If workup negative and cough persists, will need to assess for other causes of chronic cough.  Given sample of advair for bronchospasm and chest tightness. Follow-up in 1-2 weeks if symptoms still present.

## 2014-09-12 ENCOUNTER — Ambulatory Visit: Payer: BC Managed Care – PPO

## 2014-09-16 ENCOUNTER — Ambulatory Visit (INDEPENDENT_AMBULATORY_CARE_PROVIDER_SITE_OTHER): Payer: BC Managed Care – PPO

## 2014-09-16 DIAGNOSIS — Z23 Encounter for immunization: Secondary | ICD-10-CM

## 2014-09-19 ENCOUNTER — Ambulatory Visit: Payer: BC Managed Care – PPO

## 2014-09-22 IMAGING — CT CT ABD-PELV W/ CM
2 of 4 series · 15 of 46 positions shown, 17 images · IV contrast (APPLIED)
Comparison: None.

CLINICAL DATA: Unexplained fevers since delivering child 6 weeks
prior.

EXAM:
CT ABDOMEN AND PELVIS WITH CONTRAST
TECHNIQUE: Multidetector CT imaging of the abdomen and pelvis was performed
using the standard protocol following bolus administration of
intravenous contrast.
CONTRAST:  80mL OMNIPAQUE IOHEXOL 300 MG/ML  SOLN

[Series 2: abd/ pelvis 5.0 i30f 1 · axial · 0.65mm/px · z∈[+785,+1205]mm · 12 of 93 slices shown, 14 images]
[im 5/93  soft-tissue]
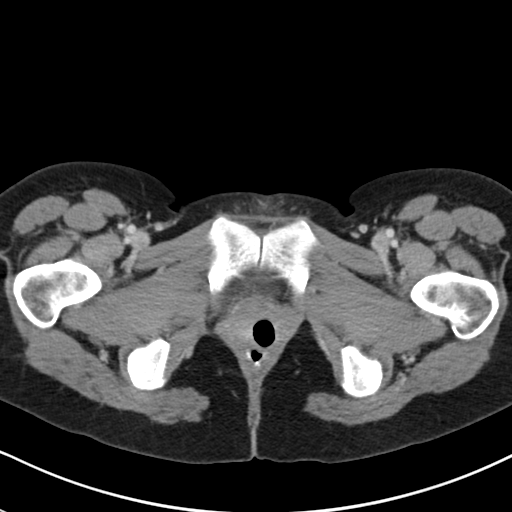
[im 5/93  bone]
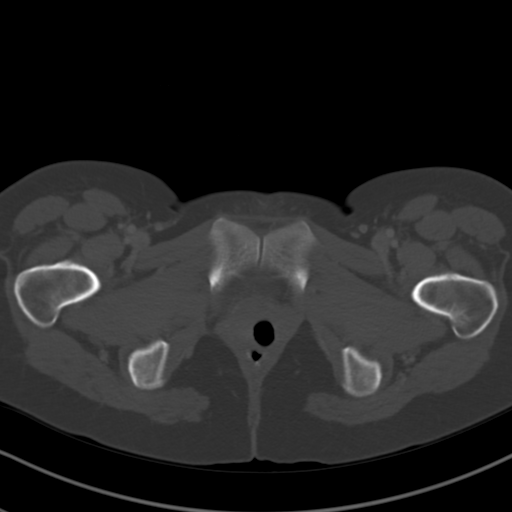
[im 13/93  soft-tissue]
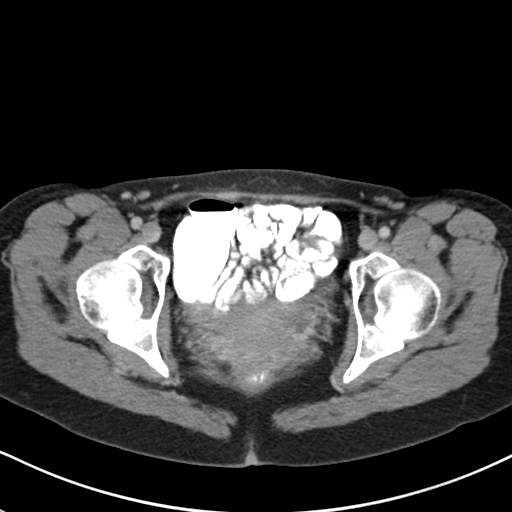
[im 21/93  soft-tissue]
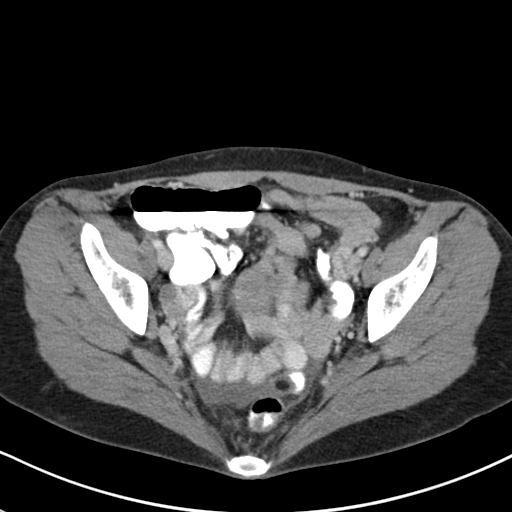
[im 29/93  soft-tissue]
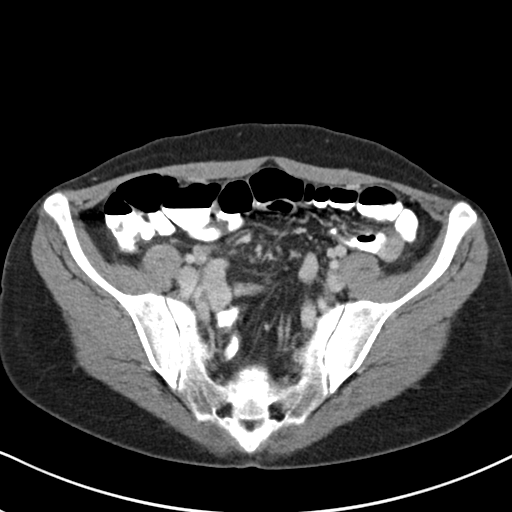
[im 37/93  soft-tissue]
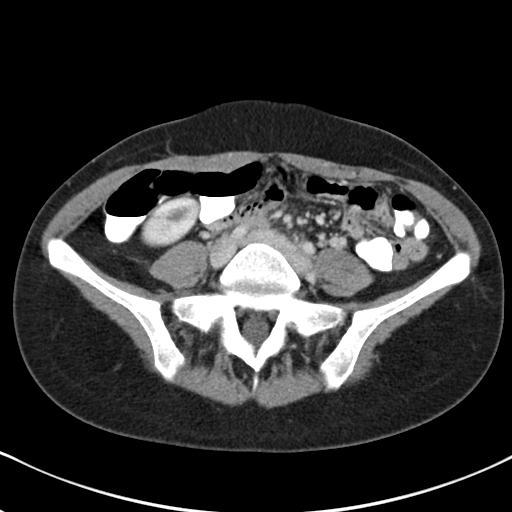
[im 45/93  soft-tissue]
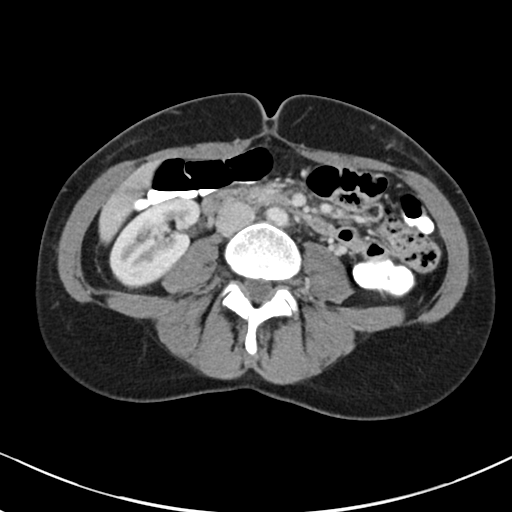
[im 49/93  soft-tissue]
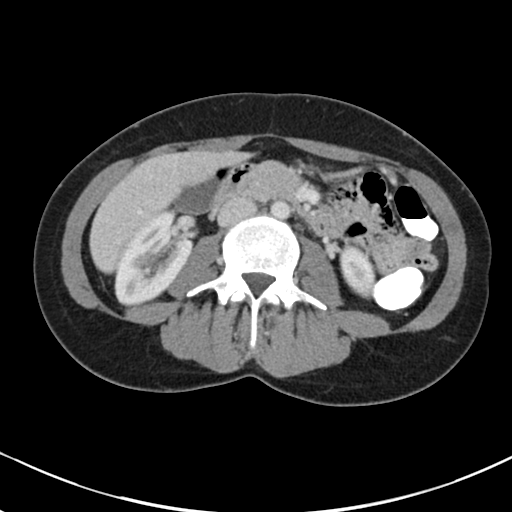
[im 57/93  soft-tissue]
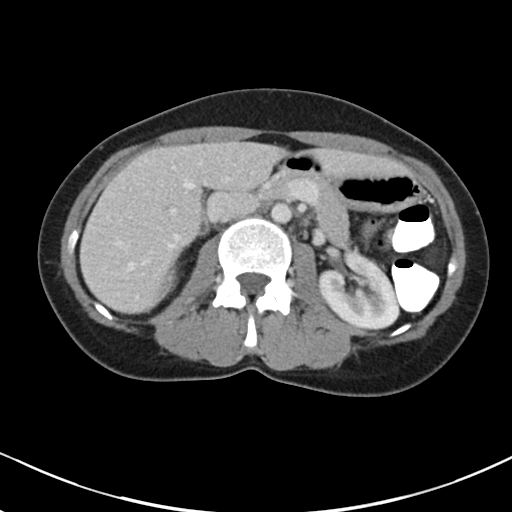
[im 65/93  soft-tissue]
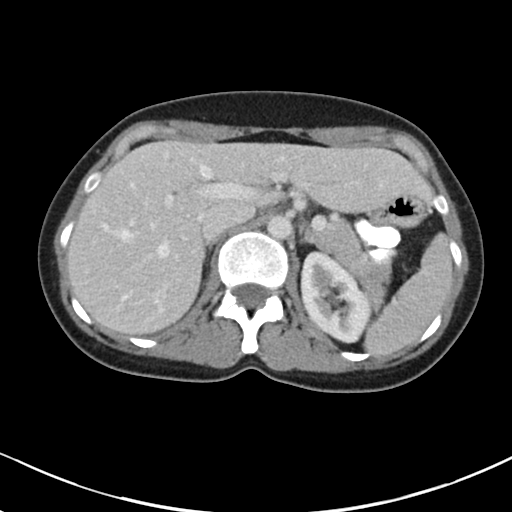
[im 65/93  bone]
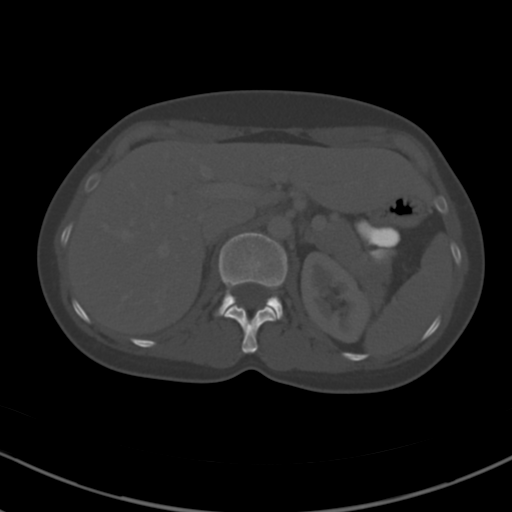
[im 73/93  soft-tissue]
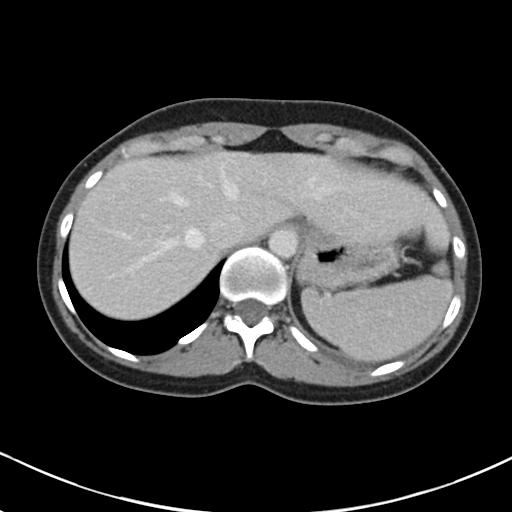
[im 81/93  soft-tissue]
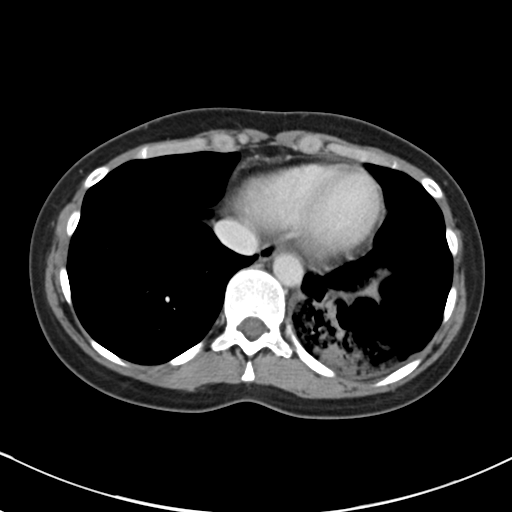
[im 89/93  soft-tissue]
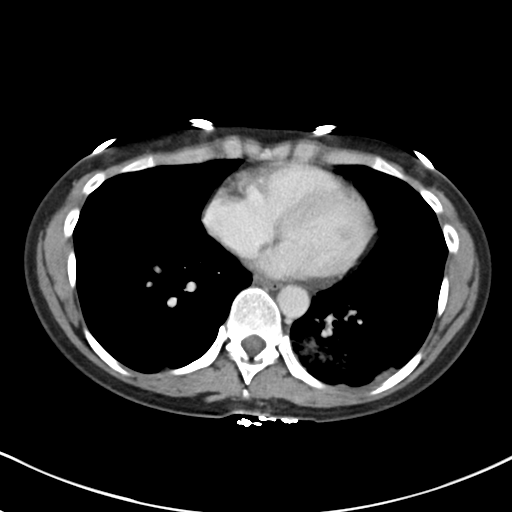

[Series 5: cor · coronal · 0.60mm/px · 3 of 136 slices shown]
[im 46/136  soft-tissue]
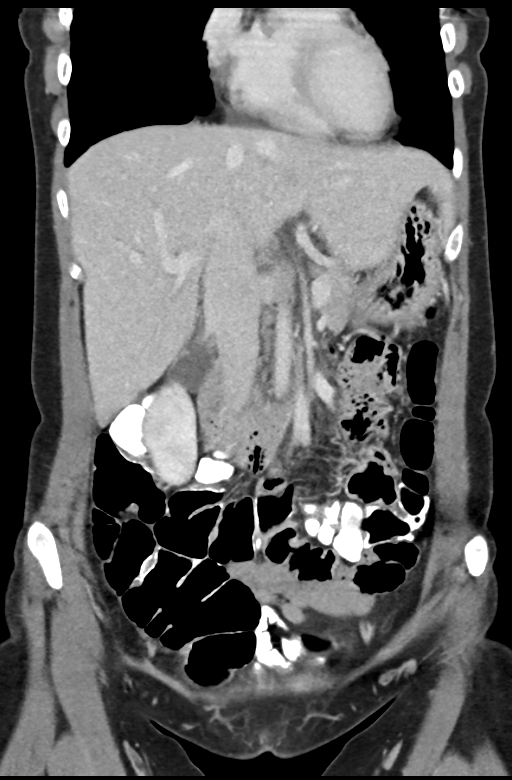
[im 61/136  soft-tissue]
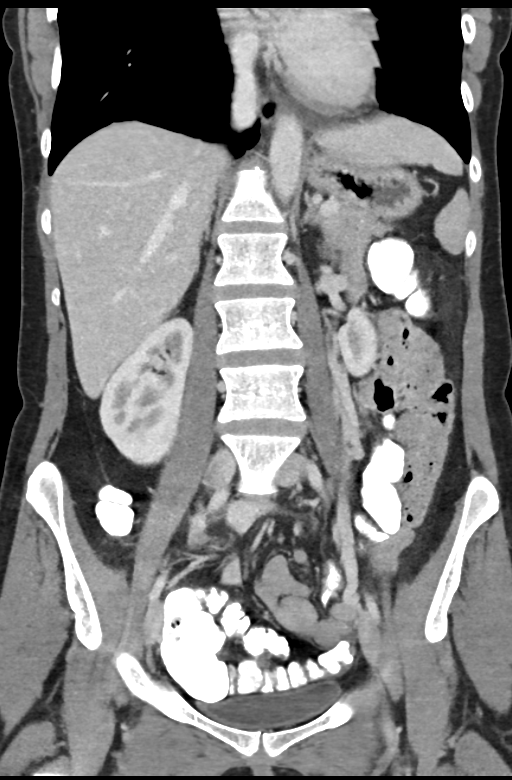
[im 76/136  soft-tissue]
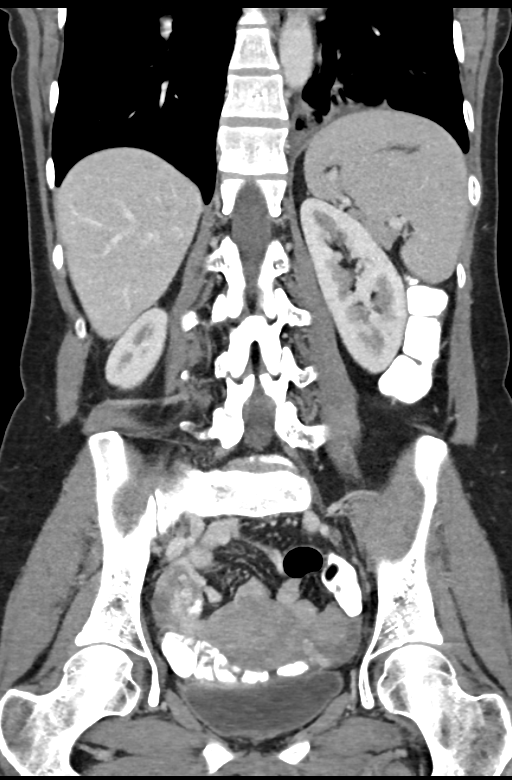

[15 of 46 positions shown; findings below may reference images not displayed]

FINDINGS: Visualization of the lower thorax demonstrates consolidative opacity
within the left lower lobe with air bronchograms. Small amount of
left pleural fluid. Minimal atelectasis within the right lung base.
Normal heart size.

Liver is normal in size and contour without focal hepatic lesion
identified. The spleen, pancreas and bilateral adrenal glands are
unremarkable. Kidneys enhance symmetrically with contrast. No
hydronephrosis.

No retroperitoneal, pelvic or mesenteric lymphadenopathy urinary
bladder is unremarkable. Small amount of free fluid in the pelvis.
Uterus is grossly unremarkable. There is a 2 cm left adnexal cystic
structure.

Oral contrast material is demonstrated throughout the bowel to the
level of the rectum. No abnormal bowel wall thickening. No evidence
for bowel obstruction. No free intraperitoneal air. Normal appendix.

No aggressive or acute appearing osseous lesions.
IMPRESSION: 1. Findings compatible with pneumonia within the left lower lobe
with a small parapneumonic effusion. Recommend radiographic followup
to ensure resolution.
2. Small amount of free fluid within the pelvis.
These results were called by telephone at the time of interpretation
on 10/25/2013 at [DATE] to Dr. Elvio, who verbally acknowledged
these results.

## 2014-10-14 ENCOUNTER — Encounter: Payer: Self-pay | Admitting: Physician Assistant

## 2015-09-18 ENCOUNTER — Ambulatory Visit (INDEPENDENT_AMBULATORY_CARE_PROVIDER_SITE_OTHER): Payer: Self-pay

## 2015-09-18 DIAGNOSIS — Z23 Encounter for immunization: Secondary | ICD-10-CM

## 2016-10-28 ENCOUNTER — Ambulatory Visit (INDEPENDENT_AMBULATORY_CARE_PROVIDER_SITE_OTHER): Payer: BLUE CROSS/BLUE SHIELD

## 2016-10-28 DIAGNOSIS — Z23 Encounter for immunization: Secondary | ICD-10-CM

## 2017-02-10 DIAGNOSIS — E010 Iodine-deficiency related diffuse (endemic) goiter: Secondary | ICD-10-CM

## 2017-02-10 HISTORY — DX: Iodine-deficiency related diffuse (endemic) goiter: E01.0

## 2017-02-14 ENCOUNTER — Telehealth: Payer: Self-pay | Admitting: *Deleted

## 2017-02-14 NOTE — Telephone Encounter (Signed)
OK with me.

## 2017-02-14 NOTE — Telephone Encounter (Signed)
Pt called stating that she was visiting with family x 4 days and one of the members was dx this morning with the flu. She is requesting Rx for Tamiflu. Please advise. Thanks.

## 2017-02-14 NOTE — Telephone Encounter (Signed)
Has to be seen since has not been in in so long.

## 2017-02-14 NOTE — Telephone Encounter (Signed)
Pt was seening Dr. Abner GreenspanBlyth when Dr. Abner GreenspanBlyth was working at [facility]. Pt has not needed to come in since then. LOV with Dr. Abner GreenspanBlyth was 2012. Pt is requesting to schedule visit for Dr. Milinda CaveMcGowen to transfer care.

## 2017-02-15 MED ORDER — OSELTAMIVIR PHOSPHATE 75 MG PO CAPS: 75.0000 mg | ORAL_CAPSULE | Freq: Every day | ORAL | 0 refills | Status: DC

## 2017-02-15 NOTE — Telephone Encounter (Signed)
SW Dr. Milinda CaveMcGowen in regards to this pt requesting Rx for Tamiflu. Pt was seeing Dr. Abner GreenspanBlyth as her PCP when Dr. Abner [facility]panBlyth was [facility] at [facility]. Pt hasn't needed to come in for office visit so she has not established with Dr. Milinda CaveMcGowen. Pt has requested to establish with Dr. Milinda CaveMcGowen and he has agreed. He has agreed to send in Rx for Tamiflu but will need pt to schedule an office visit for CPE in near future. Pt advised and voiced understanding.  CPE scheduled for 03/02/17 at 10:00am. Rx for Tamiflu 75mg  1 tab qd x 10 days sent to pts pharmacy per Dr. Verdis FredericksonMcGowens verbal request.

## 2017-03-02 ENCOUNTER — Encounter: Payer: Self-pay | Admitting: Family Medicine

## 2017-03-02 ENCOUNTER — Ambulatory Visit (INDEPENDENT_AMBULATORY_CARE_PROVIDER_SITE_OTHER): Payer: BLUE CROSS/BLUE SHIELD | Admitting: Family Medicine

## 2017-03-02 ENCOUNTER — Other Ambulatory Visit: Payer: BLUE CROSS/BLUE SHIELD

## 2017-03-02 VITALS — BP 132/86 | HR 66 | Temp 97.4°F | Resp 16 | Ht 69.75 in | Wt 121.2 lb

## 2017-03-02 DIAGNOSIS — E010 Iodine-deficiency related diffuse (endemic) goiter: Secondary | ICD-10-CM | POA: Diagnosis not present

## 2017-03-02 DIAGNOSIS — Z0000 Encounter for general adult medical examination without abnormal findings: Secondary | ICD-10-CM | POA: Diagnosis not present

## 2017-03-02 LAB — CBC WITH DIFFERENTIAL/PLATELET
BASOS PCT: 1.1 % (ref 0.0–3.0)
Basophils Absolute: 0.1 10*3/uL (ref 0.0–0.1)
Eosinophils Absolute: 0.1 10*3/uL (ref 0.0–0.7)
Eosinophils Relative: 1.8 % (ref 0.0–5.0)
HCT: 45.4 % (ref 36.0–46.0)
Hemoglobin: 15.5 g/dL — ABNORMAL HIGH (ref 12.0–15.0)
Lymphocytes Relative: 32.4 % (ref 12.0–46.0)
Lymphs Abs: 1.8 10*3/uL (ref 0.7–4.0)
MCHC: 34.2 g/dL (ref 30.0–36.0)
MCV: 97 fl (ref 78.0–100.0)
Monocytes Absolute: 0.5 10*3/uL (ref 0.1–1.0)
Monocytes Relative: 8.7 % (ref 3.0–12.0)
NEUTROS PCT: 56 % (ref 43.0–77.0)
Neutro Abs: 3.2 10*3/uL (ref 1.4–7.7)
Platelets: 273 10*3/uL (ref 150.0–400.0)
RBC: 4.68 Mil/uL (ref 3.87–5.11)
RDW: 12.7 % (ref 11.5–15.5)
WBC: 5.6 10*3/uL (ref 4.0–10.5)

## 2017-03-02 LAB — COMPREHENSIVE METABOLIC PANEL
ALBUMIN: 4.5 g/dL (ref 3.5–5.2)
ALK PHOS: 62 U/L (ref 39–117)
ALT: 15 U/L (ref 0–35)
AST: 21 U/L (ref 0–37)
BUN: 8 mg/dL (ref 6–23)
CO2: 28 mEq/L (ref 19–32)
CREATININE: 0.64 mg/dL (ref 0.40–1.20)
Calcium: 9.8 mg/dL (ref 8.4–10.5)
Chloride: 101 mEq/L (ref 96–112)
GFR: 110.86 mL/min (ref >60.00)
GLUCOSE: 83 mg/dL (ref 70–99)
Potassium: 4.8 mEq/L (ref 3.5–5.1)
SODIUM: 138 meq/L (ref 135–145)
Total Bilirubin: 0.5 mg/dL (ref 0.2–1.2)
Total Protein: 7.6 g/dL (ref 6.0–8.3)

## 2017-03-02 LAB — LIPID PANEL
CHOLESTEROL: 178 mg/dL (ref 0–200)
HDL: 88.9 mg/dL (ref >39.00)
LDL Cholesterol: 66 mg/dL (ref 0–99)
NONHDL: 89.04
Total CHOL/HDL Ratio: 2
Triglycerides: 115 mg/dL (ref 0.0–149.0)
VLDL: 23 mg/dL (ref 0.0–40.0)

## 2017-03-02 LAB — TSH: TSH: 2.02 u[IU]/mL (ref 0.35–4.50)

## 2017-03-02 NOTE — Progress Notes (Signed)
Pre visit review using our clinic review tool, if applicable. No additional management support is needed unless otherwise documented below in the visit note. 

## 2017-03-02 NOTE — Progress Notes (Signed)
Office Note 03/02/2017  CC:  Chief Complaint  Patient presents with  . Annual Exam    Pt is fasting.     HPI:  [name] is a 36 y.o. female who is here for annual health maintenance exam. Dr. Huel CoteKathy Richardson is her GYN MD--next o/v due 05/2017. Feeling well, no acute complaints.  No exercise b/c she started losing too much wt so she stopped this. Says appetite is good: "grazes" throughout the day.   Past Medical History:  Diagnosis Date  . Blood transfusion   . Chicken pox as a child  . Clotting disorder (HCC)    factor 5  . Factor V Leiden mutation complicating pregnancy (HCC)   . No pertinent past medical history     Past Surgical History:  Procedure Laterality Date  . DILATION AND CURETTAGE OF UTERUS  2008    Family History  Problem Relation Age of Onset  . Lung cancer Sister   . Lung cancer Sister   . Kidney disease Father     dialysis X 3  . Heart disease Father   . Diabetes Father   . Thyroid disease Sister   . Depression Sister     Social History   Social History  . Marital status: Married    Spouse name: N/A  . Number of children: 1  . Years of education: N/A   Occupational History  . CLAIMS REPRESENTATIVE [employer]   Social History Main Topics  . Smoking status: Never Smoker  . Smokeless tobacco: Never Used  . Alcohol use No  . Drug use: No  . Sexual activity: Yes    Partners: Male   Other Topics Concern  . Not on file   Social History Narrative   Married, 2 children.   Educ: BS Business administration.   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   Tob: none   Alc: 2 glasses per day --wine   No hx of drug use or alc abuse.      MEDS: only taking OCP (norethindrone 0.35 mg qd) and MVI at this time.   Allergies  Allergen Reactions  . Sulfonamide Derivatives     REACTION: rash    ROS Review of Systems  Constitutional: Negative for appetite change, chills, fatigue and fever.  HENT:  Negative for congestion, dental problem, ear pain and sore throat.   Eyes: Negative for discharge, redness and visual disturbance.  Respiratory: Negative for cough, chest tightness, shortness of breath and wheezing.   Cardiovascular: Negative for chest pain, palpitations and leg swelling.  Gastrointestinal: Negative for abdominal pain, blood in stool, diarrhea, nausea and vomiting.  Genitourinary: Negative for difficulty urinating, dysuria, flank pain, frequency, hematuria and urgency.  Musculoskeletal: Negative for arthralgias, back pain, joint swelling, myalgias and neck stiffness.  Skin: Negative for pallor and rash.  Neurological: Negative for dizziness, speech difficulty, weakness and headaches.  Hematological: Negative for adenopathy. Does not bruise/bleed easily.  Psychiatric/Behavioral: Negative for confusion and sleep disturbance. The patient is not nervous/anxious.     PE; Blood pressure 132/86, pulse 66, temperature 97.4 F (36.3 C), temperature source Oral, resp. rate 16, height 5' 9.75" (1.772 m), weight 121 lb 4 oz (55 kg), SpO2 99 %. Body mass index is 17.52 kg/m.  Gen: Alert, well appearing.  Patient is oriented to person, place, time, and situation. AFFECT: pleasant, lucid thought and speech. ENT: Ears: EACs clear, normal epithelium.  TMs with good light reflex and landmarks bilaterally.  Eyes: no injection,  icteris, swelling, or exudate.  EOMI, PERRLA. Nose: no drainage or turbinate edema/swelling.  No injection or focal lesion.  Mouth: lips without lesion/swelling.  Oral mucosa pink and moist.  Dentition intact and without obvious caries or gingival swelling.  Oropharynx without erythema, exudate, or swelling.  Neck: supple/nontender.  No LAD or mass.  She has diffusely palpable/enlarged thyroid gland, with more prominence on R than L.  Non-tender.  No nodule palpable. CV: RRR, no m/r/g.   LUNGS: CTA bilat, nonlabored resps, good aeration in all lung fields. ABD: soft, NT,  ND, BS normal.  No hepatospenomegaly or mass.  No bruits. EXT: no clubbing, cyanosis, or edema.  Musculoskeletal: no joint swelling, erythema, warmth, or tenderness.  ROM of all joints intact. Skin - no sores or suspicious lesions or rashes or color changes   Pertinent labs:  Lab Results  Component Value Date   TSH 1.51 01/02/2010   Lab Results  Component Value Date   WBC 10.6 (H) 09/11/2013   HGB 10.9 (L) 09/11/2013   HCT 30.6 (L) 09/11/2013   MCV 87.7 09/11/2013   PLT 199 09/11/2013   Lab Results  Component Value Date   CREATININE 0.55 09/11/2013   BUN 8 01/02/2010   NA 141 01/02/2010   K 4.1 01/02/2010   CL 108 01/02/2010   CO2 25 01/02/2010   Lab Results  Component Value Date   ALT 21 01/02/2010   AST 31 01/02/2010   ALKPHOS 73 01/02/2010   BILITOT 0.7 01/02/2010   Lab Results  Component Value Date   CHOL 215 (H) 01/02/2010   Lab Results  Component Value Date   HDL 116.40 01/02/2010   No results found for: [facility] Lab Results  Component Value Date   TRIG 126.0 01/02/2010   Lab Results  Component Value Date   CHOLHDL 2 01/02/2010   ASSESSMENT AND PLAN:   Health maintenance exam: Reviewed age and gender appropriate health maintenance issues (prudent diet, regular exercise, health risks of tobacco and excessive alcohol, use of seatbelts, fire alarms in home, use of sunscreen).  Also reviewed age and gender appropriate health screening as well as vaccine recommendations. Tetanus and flu vaccines UTD. Fasting HP labs drawn today. Will further eval thyromegaly on exam with TSH testing as well as ultrasound soft tissue neck--ordered today.  An After Visit Summary was printed and given to the patient.  FOLLOW UP:  Return in about 1 year (around 03/02/2018) for annual CPE (fasting).  Signed:  Santiago Bumpers, MD           03/02/2017

## 2017-03-02 NOTE — Patient Instructions (Signed)
Health Maintenance, Female Adopting a healthy lifestyle and getting preventive care can go a long way to promote health and wellness. Talk with your health care provider about what schedule of regular examinations is right for you. This is a good chance for you to check in with your provider about disease prevention and staying healthy. In between checkups, there are plenty of things you can do on your own. Experts have done a lot of research about which lifestyle changes and preventive measures are most likely to keep you healthy. Ask your health care provider for more information. Weight and diet Eat a healthy diet  Be sure to include plenty of vegetables, fruits, low-fat dairy products, and lean protein.  Do not eat a lot of foods high in solid fats, added sugars, or salt.  Get regular exercise. This is one of the most important things you can do for your health.  Most adults should exercise for at least 150 minutes each week. The exercise should increase your heart rate and make you sweat (moderate-intensity exercise).  Most adults should also do strengthening exercises at least twice a week. This is in addition to the moderate-intensity exercise. Maintain a healthy weight  Body mass index (BMI) is a measurement that can be used to identify possible weight problems. It estimates body fat based on height and weight. Your health care provider can help determine your BMI and help you achieve or maintain a healthy weight.  For females 76 years of age and older:  A BMI below 18.5 is considered underweight.  A BMI of 18.5 to 24.9 is normal.  A BMI of 25 to 29.9 is considered overweight.  A BMI of 30 and above is considered obese. Watch levels of cholesterol and blood lipids  You should start having your blood tested for lipids and cholesterol at 37 years of age, then have this test every 5 years.  You may need to have your cholesterol levels checked more often if:  Your lipid or  cholesterol levels are high.  You are older than 37 years of age.  You are at high risk for heart disease. Cancer screening Lung Cancer  Lung cancer screening is recommended for adults 64-42 years old who are at high risk for lung cancer because of a history of smoking.  A yearly low-dose CT scan of the lungs is recommended for people who:  Currently smoke.  Have quit within the past 15 years.  Have at least a 30-pack-year history of smoking. A pack year is smoking an average of one pack of cigarettes a day for 1 year.  Yearly screening should continue until it has been 15 years since you quit.  Yearly screening should stop if you develop a health problem that would prevent you from having lung cancer treatment. Breast Cancer  Practice breast self-awareness. This means understanding how your breasts normally appear and feel.  It also means doing regular breast self-exams. Let your health care provider know about any changes, no matter how small.  If you are in your 20s or 30s, you should have a clinical breast exam (CBE) by a health care provider every 1-3 years as part of a regular health exam.  If you are 34 or older, have a CBE every year. Also consider having a breast X-ray (mammogram) every year.  If you have a family history of breast cancer, talk to your health care provider about genetic screening.  If you are at high risk for breast cancer, talk  to your health care provider about having an MRI and a mammogram every year.  Breast cancer gene (BRCA) assessment is recommended for women who have family members with BRCA-related cancers. BRCA-related cancers include:  Breast.  Ovarian.  Tubal.  Peritoneal cancers.  Results of the assessment will determine the need for genetic counseling and BRCA1 and BRCA2 testing. Cervical Cancer  Your health care provider may recommend that you be screened regularly for cancer of the pelvic organs (ovaries, uterus, and vagina).  This screening involves a pelvic examination, including checking for microscopic changes to the surface of your cervix (Pap test). You may be encouraged to have this screening done every 3 years, beginning at age 24.  For women ages 66-65, health care providers may recommend pelvic exams and Pap testing every 3 years, or they may recommend the Pap and pelvic exam, combined with testing for human papilloma virus (HPV), every 5 years. Some types of HPV increase your risk of cervical cancer. Testing for HPV may also be done on women of any age with unclear Pap test results.  Other health care providers may not recommend any screening for nonpregnant women who are considered low risk for pelvic cancer and who do not have symptoms. Ask your health care provider if a screening pelvic exam is right for you.  If you have had past treatment for cervical cancer or a condition that could lead to cancer, you need Pap tests and screening for cancer for at least 20 years after your treatment. If Pap tests have been discontinued, your risk factors (such as having a new sexual partner) need to be reassessed to determine if screening should resume. Some women have medical problems that increase the chance of getting cervical cancer. In these cases, your health care provider may recommend more frequent screening and Pap tests. Colorectal Cancer  This type of cancer can be detected and often prevented.  Routine colorectal cancer screening usually begins at 37 years of age and continues through 37 years of age.  Your health care provider may recommend screening at an earlier age if you have risk factors for colon cancer.  Your health care provider may also recommend using home test kits to check for hidden blood in the stool.  A small camera at the end of a tube can be used to examine your colon directly (sigmoidoscopy or colonoscopy). This is done to check for the earliest forms of colorectal cancer.  Routine  screening usually begins at age 41.  Direct examination of the colon should be repeated every 5-10 years through 37 years of age. However, you may need to be screened more often if early forms of precancerous polyps or small growths are found. Skin Cancer  Check your skin from head to toe regularly.  Tell your health care provider about any new moles or changes in moles, especially if there is a change in a mole's shape or color.  Also tell your health care provider if you have a mole that is larger than the size of a pencil eraser.  Always use sunscreen. Apply sunscreen liberally and repeatedly throughout the day.  Protect yourself by wearing long sleeves, pants, a wide-brimmed hat, and sunglasses whenever you are outside. Heart disease, diabetes, and high blood pressure  High blood pressure causes heart disease and increases the risk of stroke. High blood pressure is more likely to develop in:  People who have blood pressure in the high end of the normal range (130-139/85-89 mm Hg).  People who are overweight or obese.  People who are African American.  If you are 59-24 years of age, have your blood pressure checked every 3-5 years. If you are 34 years of age or older, have your blood pressure checked every year. You should have your blood pressure measured twice-once when you are at a hospital or clinic, and once when you are not at a hospital or clinic. Record the average of the two measurements. To check your blood pressure when you are not at a hospital or clinic, you can use:  An automated blood pressure machine at a pharmacy.  A home blood pressure monitor.  If you are between 29 years and 60 years old, ask your health care provider if you should take aspirin to prevent strokes.  Have regular diabetes screenings. This involves taking a blood sample to check your fasting blood sugar level.  If you are at a normal weight and have a low risk for diabetes, have this test once  every three years after 37 years of age.  If you are overweight and have a high risk for diabetes, consider being tested at a younger age or more often. Preventing infection Hepatitis B  If you have a higher risk for hepatitis B, you should be screened for this virus. You are considered at high risk for hepatitis B if:  You were born in a country where hepatitis B is common. Ask your health care provider which countries are considered high risk.  Your parents were born in a high-risk country, and you have not been immunized against hepatitis B (hepatitis B vaccine).  You have HIV or AIDS.  You use needles to inject street drugs.  You live with someone who has hepatitis B.  You have had sex with someone who has hepatitis B.  You get hemodialysis treatment.  You take certain medicines for conditions, including cancer, organ transplantation, and autoimmune conditions. Hepatitis C  Blood testing is recommended for:  Everyone born from [birth year] through 1966.  Anyone with known risk factors for hepatitis C. Sexually transmitted infections (STIs)  You should be screened for sexually transmitted infections (STIs) including gonorrhea and chlamydia if:  You are sexually active and are younger than 37 years of age.  You are older than 37 years of age and your health care provider tells you that you are at risk for this type of infection.  Your sexual activity has changed since you were last screened and you are at an increased risk for chlamydia or gonorrhea. Ask your health care provider if you are at risk.  If you do not have HIV, but are at risk, it may be recommended that you take a prescription medicine daily to prevent HIV infection. This is called pre-exposure prophylaxis (PrEP). You are considered at risk if:  You are sexually active and do not regularly use condoms or know the HIV status of your partner(s).  You take drugs by injection.  You are sexually active with a partner  who has HIV. Talk with your health care provider about whether you are at high risk of being infected with HIV. If you choose to begin PrEP, you should first be tested for HIV. You should then be tested every 3 months for as long as you are taking PrEP. Pregnancy  If you are premenopausal and you may become pregnant, ask your health care provider about preconception counseling.  If you may become pregnant, take 400 to 800 micrograms (mcg) of folic acid  every day.  If you want to prevent pregnancy, talk to your health care provider about birth control (contraception). Osteoporosis and menopause  Osteoporosis is a disease in which the bones lose minerals and strength with aging. This can result in serious bone fractures. Your risk for osteoporosis can be identified using a bone density scan.  If you are 4 years of age or older, or if you are at risk for osteoporosis and fractures, ask your health care provider if you should be screened.  Ask your health care provider whether you should take a calcium or vitamin D supplement to lower your risk for osteoporosis.  Menopause may have certain physical symptoms and risks.  Hormone replacement therapy may reduce some of these symptoms and risks. Talk to your health care provider about whether hormone replacement therapy is right for you. Follow these instructions at home:  Schedule regular health, dental, and eye exams.  Stay current with your immunizations.  Do not use any tobacco products including cigarettes, chewing tobacco, or electronic cigarettes.  If you are pregnant, do not drink alcohol.  If you are breastfeeding, limit how much and how often you drink alcohol.  Limit alcohol intake to no more than 1 drink per day for nonpregnant women. One drink equals 12 ounces of beer, 5 ounces of wine, or 1 ounces of hard liquor.  Do not use street drugs.  Do not share needles.  Ask your health care provider for help if you need support  or information about quitting drugs.  Tell your health care provider if you often feel depressed.  Tell your health care provider if you have ever been abused or do not feel safe at home. This information is not intended to replace advice given to you by your health care provider. Make sure you discuss any questions you have with your health care provider. Document Released: 06/14/2011 Document Revised: 05/06/2016 Document Reviewed: 09/02/2015 Elsevier Interactive Patient Education  2017 [employer].

## 2017-03-03 ENCOUNTER — Ambulatory Visit (INDEPENDENT_AMBULATORY_CARE_PROVIDER_SITE_OTHER): Payer: BLUE CROSS/BLUE SHIELD

## 2017-03-03 ENCOUNTER — Other Ambulatory Visit: Payer: BLUE CROSS/BLUE SHIELD

## 2017-03-03 ENCOUNTER — Encounter: Payer: Self-pay | Admitting: Family Medicine

## 2017-03-03 DIAGNOSIS — E010 Iodine-deficiency related diffuse (endemic) goiter: Secondary | ICD-10-CM

## 2017-03-03 DIAGNOSIS — E041 Nontoxic single thyroid nodule: Secondary | ICD-10-CM

## 2017-10-11 LAB — HM PAP SMEAR

## 2017-10-12 ENCOUNTER — Encounter: Payer: Self-pay | Admitting: Family Medicine

## 2017-11-02 ENCOUNTER — Ambulatory Visit (INDEPENDENT_AMBULATORY_CARE_PROVIDER_SITE_OTHER): Payer: BLUE CROSS/BLUE SHIELD

## 2017-11-02 DIAGNOSIS — Z23 Encounter for immunization: Secondary | ICD-10-CM | POA: Diagnosis not present

## 2018-12-12 IMAGING — US US SOFT TISSUE HEAD/NECK
1 series · 13 of 25 positions shown · non-contrast
Comparison: None.

CLINICAL DATA: Thyromegaly on physical exam.

EXAM:
THYROID ULTRASOUND
TECHNIQUE: Ultrasound examination of the thyroid gland and adjacent soft
tissues was performed.

[Series 1: us soft tissue head/neck · 0.05mm/px · 13 of 34 slices shown]
[im 1/34]
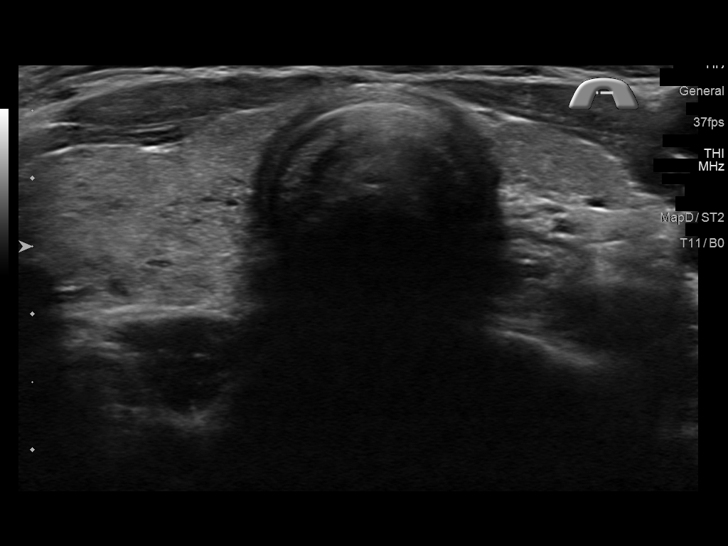
[im 3/34]
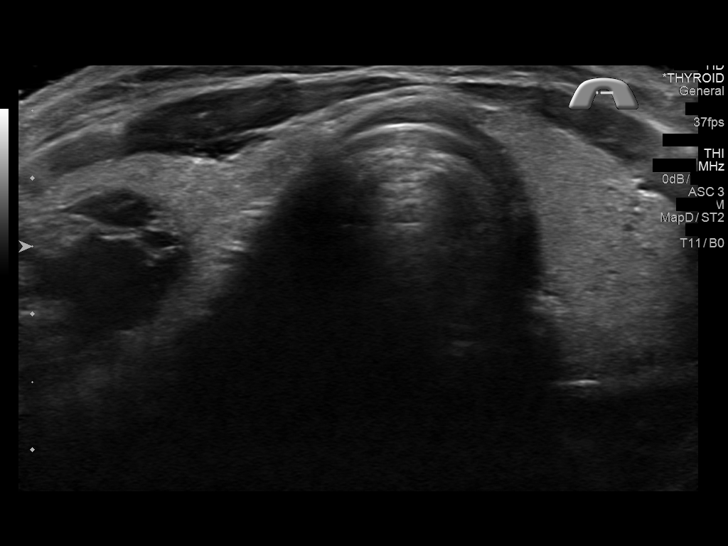
[im 6/34]
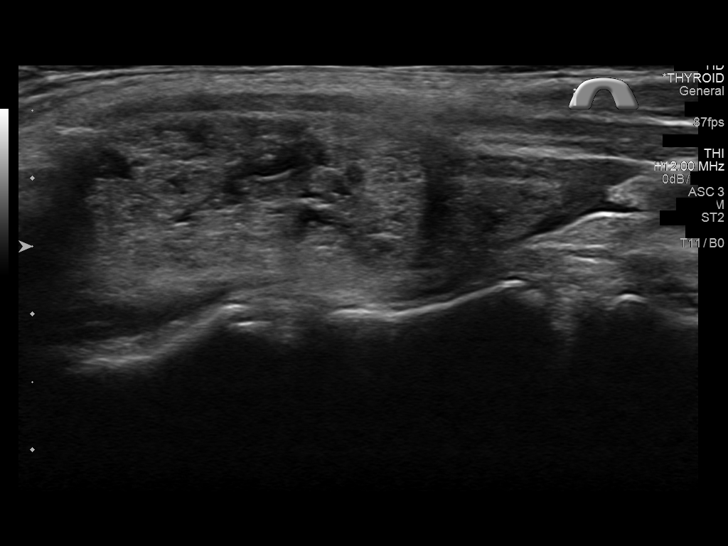
[im 9/34]
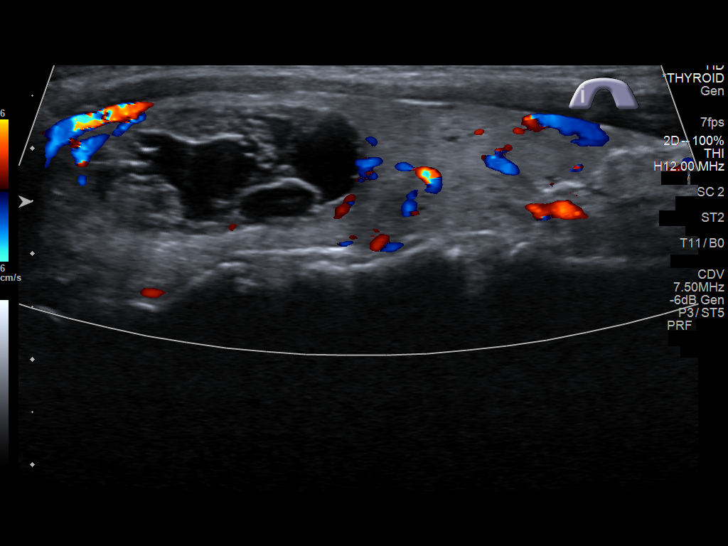
[im 12/34]
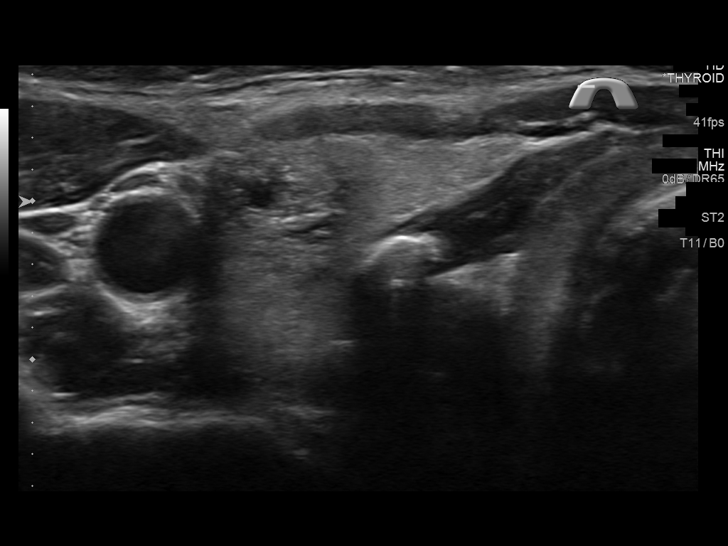
[im 14/34]
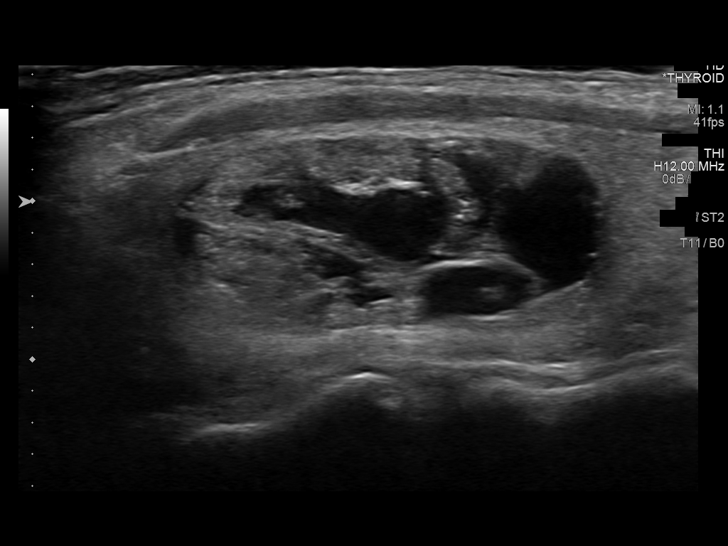
[im 17/34]
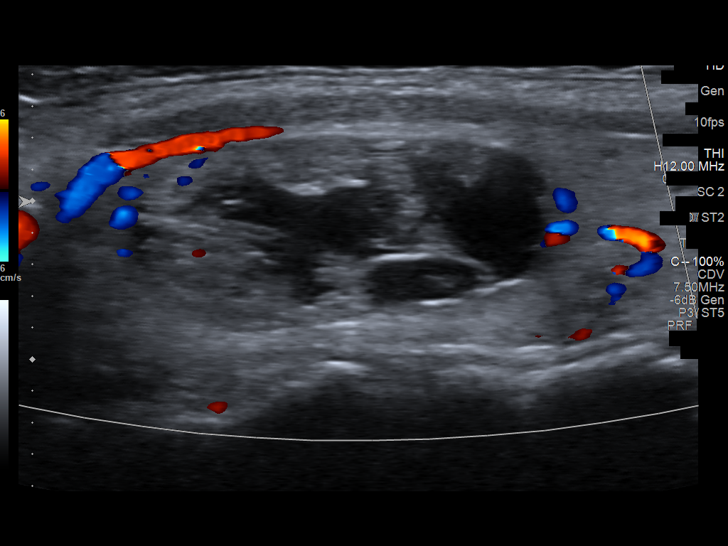
[im 20/34]
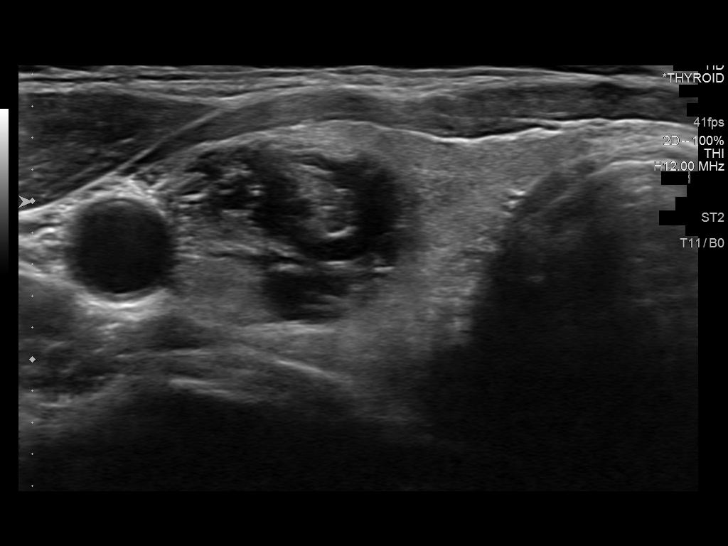
[im 23/34]
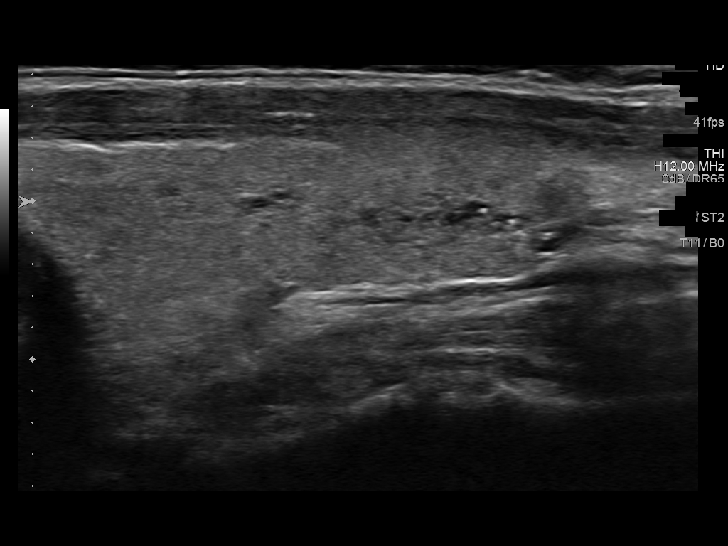
[im 25/34]
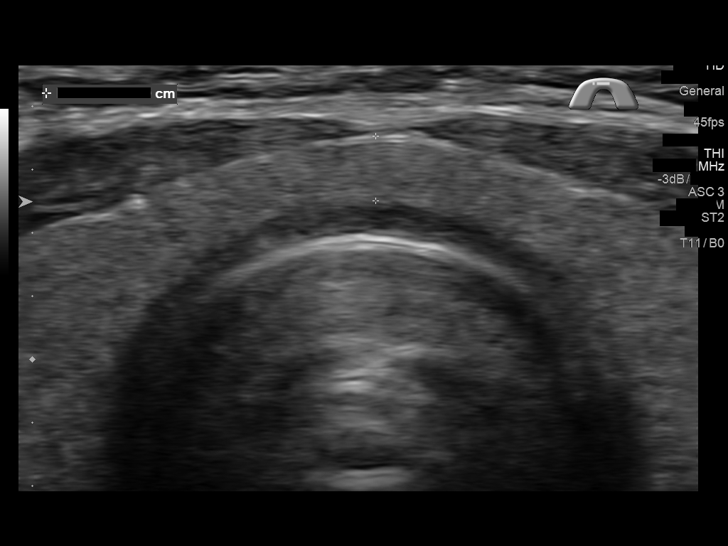
[im 28/34]
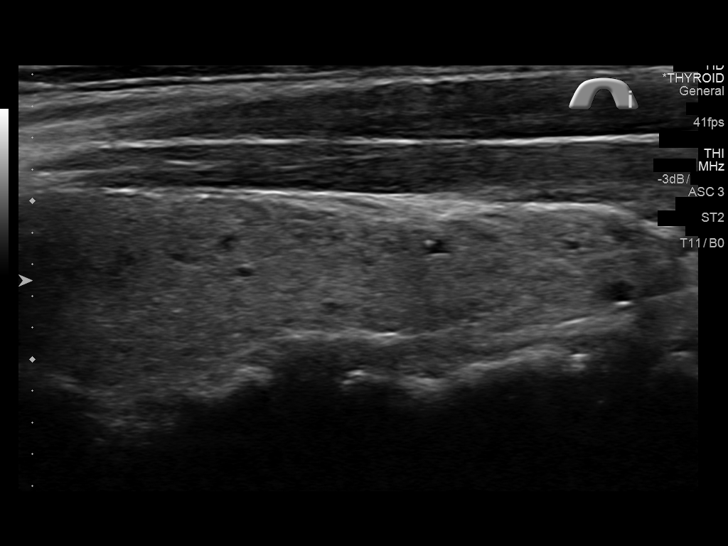
[im 31/34]
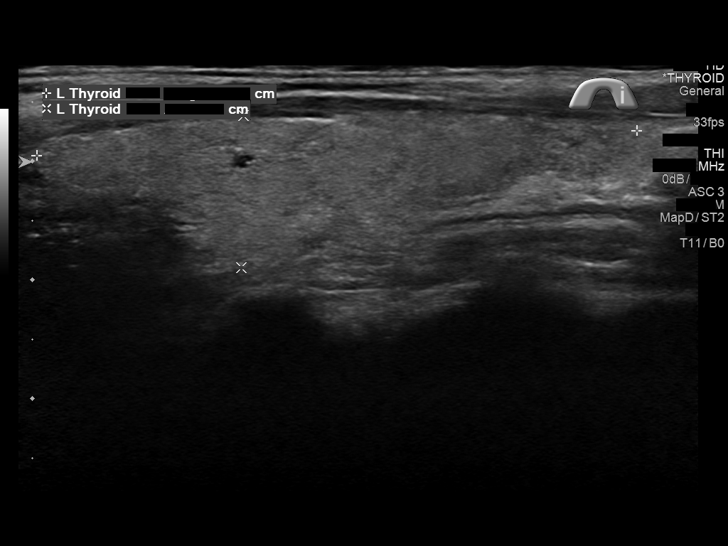
[im 34/34]
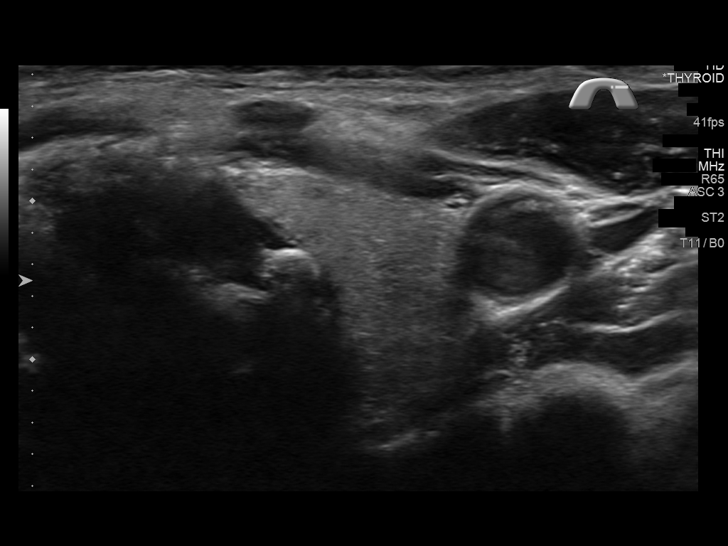

[13 of 25 positions shown; findings below may reference images not displayed]

FINDINGS: Parenchymal Echotexture: Mildly heterogenous

Isthmus: 0.2 cm thickness

Right lobe: 5.3 x 1.5 x 2.1 cm

Left lobe: 5.1 x 1.3 x 1.4 cm

_________________________________________________________

Estimated total number of nodules >/= 1 cm: 1

Number of spongiform nodules >/=  2 cm not described below (TR1): 0

Number of mixed cystic and solid nodules >/= 1.5 cm not described
below (TR2): 0

_________________________________________________________

Nodule # 1:

Location: Right; Superior

Maximum size: 2.7 cm; Other 2 dimensions: 1.2 x 1.7 cm

Composition: mixed cystic and solid (1)

Echogenicity: isoechoic (1)

Shape: not taller-than-wide (0)

Margins: ill-defined (0)

Echogenic foci: none (0)

ACR TI-RADS total points: 2.

ACR TI-RADS risk category: TR2 (2 points).

ACR TI-RADS recommendations:

This nodule does NOT meet TI-RADS criteria for biopsy or dedicated
follow-up.

_________________________________________________________
IMPRESSION: 1. Thyromegaly with a single mixed cystic/solid right nodule, which
does not meet criteria for biopsy or dedicated imaging follow-up.

The above is in keeping with the ACR TI-RADS recommendations - [HOSPITAL] 8834;[DATE].
